# Patient Record
Sex: Male | Born: 1945 | Race: White | Hispanic: No | Marital: Married | Smoking: Former smoker
Health system: Southern US, Community
[De-identification: ages and names within clinical notes are randomized; demographics above are authoritative.]

## PROBLEM LIST (undated history)

## (undated) DIAGNOSIS — K219 Gastro-esophageal reflux disease without esophagitis: Secondary | ICD-10-CM

## (undated) DIAGNOSIS — G8929 Other chronic pain: Secondary | ICD-10-CM

## (undated) DIAGNOSIS — I10 Essential (primary) hypertension: Secondary | ICD-10-CM

## (undated) DIAGNOSIS — M545 Low back pain, unspecified: Secondary | ICD-10-CM

## (undated) DIAGNOSIS — M5430 Sciatica, unspecified side: Secondary | ICD-10-CM

## (undated) DIAGNOSIS — E785 Hyperlipidemia, unspecified: Secondary | ICD-10-CM

## (undated) HISTORY — DX: Sciatica, unspecified side: M54.30

## (undated) HISTORY — PX: MASTECTOMY: SHX3

## (undated) HISTORY — PX: LAPAROSCOPIC CHOLECYSTECTOMY: SUR755

## (undated) HISTORY — DX: Hyperlipidemia, unspecified: E78.5

## (undated) HISTORY — PX: SHOULDER SURGERY: SHX246

## (undated) HISTORY — DX: Low back pain, unspecified: M54.50

## (undated) HISTORY — DX: Low back pain: M54.5

## (undated) HISTORY — PX: WRIST ARTHRODESIS: SUR65

## (undated) HISTORY — DX: Other chronic pain: G89.29

## (undated) HISTORY — DX: Essential (primary) hypertension: I10

---

## 2000-08-06 ENCOUNTER — Ambulatory Visit (HOSPITAL_COMMUNITY): Admission: RE | Admit: 2000-08-06 | Discharge: 2000-08-06 | Payer: Self-pay | Admitting: Dermatology

## 2000-08-06 ENCOUNTER — Encounter: Payer: Self-pay | Admitting: Dermatology

## 2001-06-06 ENCOUNTER — Encounter: Payer: Self-pay | Admitting: Preventative Medicine

## 2001-06-06 ENCOUNTER — Ambulatory Visit (HOSPITAL_COMMUNITY): Admission: RE | Admit: 2001-06-06 | Discharge: 2001-06-06 | Payer: Self-pay | Admitting: Preventative Medicine

## 2001-06-10 ENCOUNTER — Encounter: Payer: Self-pay | Admitting: Preventative Medicine

## 2001-06-10 ENCOUNTER — Ambulatory Visit (HOSPITAL_COMMUNITY): Admission: RE | Admit: 2001-06-10 | Discharge: 2001-06-10 | Payer: Self-pay | Admitting: Preventative Medicine

## 2001-09-14 ENCOUNTER — Encounter: Payer: Self-pay | Admitting: Dermatology

## 2001-09-14 ENCOUNTER — Ambulatory Visit (HOSPITAL_COMMUNITY): Admission: RE | Admit: 2001-09-14 | Discharge: 2001-09-14 | Payer: Self-pay | Admitting: Dermatology

## 2002-02-17 ENCOUNTER — Emergency Department (HOSPITAL_COMMUNITY): Admission: EM | Admit: 2002-02-17 | Discharge: 2002-02-17 | Payer: Self-pay

## 2002-04-06 ENCOUNTER — Emergency Department (HOSPITAL_COMMUNITY): Admission: EM | Admit: 2002-04-06 | Discharge: 2002-04-06 | Payer: Self-pay | Admitting: *Deleted

## 2002-04-06 ENCOUNTER — Encounter: Payer: Self-pay | Admitting: *Deleted

## 2002-06-06 ENCOUNTER — Encounter: Admission: RE | Admit: 2002-06-06 | Discharge: 2002-07-26 | Payer: Self-pay | Admitting: Family Medicine

## 2002-06-13 ENCOUNTER — Encounter: Admission: RE | Admit: 2002-06-13 | Discharge: 2002-06-13 | Payer: Self-pay | Admitting: Gastroenterology

## 2002-06-13 ENCOUNTER — Encounter: Payer: Self-pay | Admitting: Gastroenterology

## 2002-07-11 ENCOUNTER — Encounter: Payer: Self-pay | Admitting: Family Medicine

## 2002-07-11 ENCOUNTER — Ambulatory Visit (HOSPITAL_COMMUNITY): Admission: RE | Admit: 2002-07-11 | Discharge: 2002-07-11 | Payer: Self-pay | Admitting: Family Medicine

## 2002-08-30 ENCOUNTER — Encounter (INDEPENDENT_AMBULATORY_CARE_PROVIDER_SITE_OTHER): Payer: Self-pay

## 2002-08-30 ENCOUNTER — Ambulatory Visit (HOSPITAL_COMMUNITY): Admission: RE | Admit: 2002-08-30 | Discharge: 2002-08-30 | Payer: Self-pay | Admitting: Gastroenterology

## 2002-09-22 ENCOUNTER — Encounter: Payer: Self-pay | Admitting: Orthopedic Surgery

## 2002-09-22 ENCOUNTER — Ambulatory Visit (HOSPITAL_COMMUNITY): Admission: RE | Admit: 2002-09-22 | Discharge: 2002-09-22 | Payer: Self-pay | Admitting: Orthopedic Surgery

## 2002-11-13 ENCOUNTER — Encounter (HOSPITAL_COMMUNITY): Admission: RE | Admit: 2002-11-13 | Discharge: 2002-12-13 | Payer: Self-pay | Admitting: Orthopedic Surgery

## 2002-12-14 ENCOUNTER — Encounter (HOSPITAL_COMMUNITY): Admission: RE | Admit: 2002-12-14 | Discharge: 2003-01-13 | Payer: Self-pay | Admitting: Orthopedic Surgery

## 2004-01-15 ENCOUNTER — Ambulatory Visit: Payer: Self-pay | Admitting: Cardiology

## 2004-10-14 ENCOUNTER — Ambulatory Visit (HOSPITAL_COMMUNITY): Admission: RE | Admit: 2004-10-14 | Discharge: 2004-10-14 | Payer: Self-pay | Admitting: Dermatology

## 2005-07-20 ENCOUNTER — Ambulatory Visit (HOSPITAL_COMMUNITY): Admission: RE | Admit: 2005-07-20 | Discharge: 2005-07-20 | Payer: Self-pay | Admitting: Dermatology

## 2006-10-23 ENCOUNTER — Encounter: Admission: RE | Admit: 2006-10-23 | Discharge: 2006-10-23 | Payer: Self-pay | Admitting: Orthopedic Surgery

## 2007-02-24 ENCOUNTER — Ambulatory Visit (HOSPITAL_BASED_OUTPATIENT_CLINIC_OR_DEPARTMENT_OTHER): Admission: RE | Admit: 2007-02-24 | Discharge: 2007-02-24 | Payer: Self-pay | Admitting: Orthopedic Surgery

## 2007-03-09 ENCOUNTER — Encounter (HOSPITAL_COMMUNITY): Admission: RE | Admit: 2007-03-09 | Discharge: 2007-04-08 | Payer: Self-pay | Admitting: Orthopedic Surgery

## 2008-03-21 ENCOUNTER — Encounter: Admission: RE | Admit: 2008-03-21 | Discharge: 2008-03-21 | Payer: Self-pay | Admitting: Orthopedic Surgery

## 2008-07-11 ENCOUNTER — Emergency Department (HOSPITAL_COMMUNITY): Admission: EM | Admit: 2008-07-11 | Discharge: 2008-07-11 | Payer: Self-pay | Admitting: Emergency Medicine

## 2009-04-26 ENCOUNTER — Encounter: Admission: RE | Admit: 2009-04-26 | Discharge: 2009-04-26 | Payer: Self-pay | Admitting: Family Medicine

## 2009-05-08 ENCOUNTER — Encounter (HOSPITAL_COMMUNITY): Admission: RE | Admit: 2009-05-08 | Discharge: 2009-06-07 | Payer: Self-pay | Admitting: Family Medicine

## 2009-12-24 ENCOUNTER — Ambulatory Visit: Payer: Self-pay | Admitting: Cardiology

## 2009-12-24 DIAGNOSIS — R002 Palpitations: Secondary | ICD-10-CM

## 2009-12-24 DIAGNOSIS — E785 Hyperlipidemia, unspecified: Secondary | ICD-10-CM | POA: Insufficient documentation

## 2009-12-24 DIAGNOSIS — I10 Essential (primary) hypertension: Secondary | ICD-10-CM | POA: Insufficient documentation

## 2009-12-25 ENCOUNTER — Encounter: Payer: Self-pay | Admitting: Cardiology

## 2009-12-30 ENCOUNTER — Ambulatory Visit (HOSPITAL_COMMUNITY)
Admission: RE | Admit: 2009-12-30 | Discharge: 2009-12-31 | Payer: Self-pay | Source: Home / Self Care | Admitting: Cardiology

## 2010-01-07 ENCOUNTER — Ambulatory Visit: Payer: Self-pay | Admitting: Cardiovascular Disease

## 2010-01-09 ENCOUNTER — Encounter: Payer: Self-pay | Admitting: Adult Health

## 2010-02-25 NOTE — Assessment & Plan Note (Signed)
Summary: ***per Dr.Fred Andrey Campanile for palpitaitons/tg   Visit Type:  Initial Consult Primary Provider:  Dr. Benedetto Goad   History of Present Illness: 65 year old male referred for cardiology consultation. He reports a long-standing, at least 2 year, history of intermittent palpitations. Records indicate that he was seen by Dr. Elease Hashimoto within the year, although details are unavailable. The patient states he did not undergo any further cardiac testing other than ECG.  He describes feeling occasionally "racing" heart beats, sometimes "skips" usually when he is quiet and still. This does not bother him much at work. He does physical labor in the HVAC, contracting business. No clearly reproducible exertional chest pain compatible with angina. No unusual shortness of breath. He has not had any frank dizziness or syncope associated with palpitations. He reports that the symptoms seem to be increasing in frequency, essentially daily at this time. This is despite cutting back caffeine.  Faxed copy of ECG from May of this year shows sinus bradycardia at 58 beats per minute, nonspecific T-wave changes.  He has never worn a cardiac monitor to objectively document what he is feeling.  Preventive Screening-Counseling & Management  Alcohol-Tobacco     Smoking Status: never  Current Medications (verified): 1)  Clonazepam 1 Mg Tabs (Clonazepam) .... Take 1/2 Tab As Needed 2)  Lipitor 20 Mg Tabs (Atorvastatin Calcium) .... Take 1 Tab Daily 3)  Amlodipine Besylate 5 Mg Tabs (Amlodipine Besylate) .... Take 1 Tab Daily 4)  Benazepril Hcl 10 Mg Tabs (Benazepril Hcl) .... Take 1 Tab Daily 5)  Omeprazole 20 Mg Cpdr (Omeprazole) .... Take 1 Tab Daily 6)  Aspir-Low 81 Mg Tbec (Aspirin) .... Take 1 Tab Daily 7)  Flax Seed Oil 1000 Mg Caps (Flaxseed (Linseed)) .... Take 1 Tab Daily 8)  Folic Acid 1 Mg Tabs (Folic Acid) .... Take 1 Tab Daily 9)  Metamucil 48.57 % Powd (Psyllium) .... Use Daily 10)  Vitamin D3 1000  Unit Caps (Cholecalciferol) .... Take 1 Tab Daily  Allergies (verified): 1)  ! Codeine  Past History:  Family History: Last updated: 12/24/2009 Father: CABG age 39, also history of stroke and pacemaker Mother: died age 23 with lung cancer Siblings: brother without known cardiac disease  Social History: Last updated: 12/24/2009 Full Time - HVAC/contractor Tobacco Use - No.  Alcohol Use - yes, socially  Past Medical History: Hyperlipidemia Hypertension Sciatica Herpes simplex Chronic lower back pain  Past Surgical History: Right shoulder surgery Laparoscopic cholecystectomy Mastectomy  Family History: Father: CABG age 56, also history of stroke and pacemaker Mother: died age 5 with lung cancer Siblings: brother without known cardiac disease  Social History: Full Time - HVAC/contractor Tobacco Use - No.  Alcohol Use - yes, socially Smoking Status:  never  Review of Systems  The patient denies anorexia, fever, weight loss, syncope, dyspnea on exertion, peripheral edema, prolonged cough, headaches, abdominal pain, melena, and hematochezia.         Otherwise reviewed and negative.  Vital Signs:  Patient profile:   65 year old male Height:      68 inches Weight:      164 pounds BMI:     25.03 Pulse rate:   58 / minute BP sitting:   133 / 86  (right arm)  Vitals Entered By: Dreama Saa, CNA (December 24, 2009 9:14 AM)  Physical Exam  Additional Exam:  Normally nourished appearing male in no acute distress. HEENT: Conjunctiva and lids normal, oropharynx with moist mucosa. Neck: Supple, no elevated JVP or  bruits, no thyromegaly Lungs: Clear to auscultation, nonlabored. Cardiac: Regular rate and rhythm, no S3 or pericardial rub. No significant systolic murmur. Abdomen: Soft, nontender, bowel sounds present. Skin: Warm and dry. Musculoskeletal: No gross deformity. Extremities: No pitting edema, distal pulses full. Neuropsychiatric: Alert and oriented x3,  affect appropriate.   EKG  Procedure date:  12/24/2009  Findings:      Sinus bradycardia at 55 beats per minute.  Impression & Recommendations:  Problem # 1:  PALPITATIONS (ICD-785.1)  Long-standing, intermittent over time, however progressive in nature despite cutting back caffeine. No clear precipitant with exertion, no dizziness or syncope. Resting ECG is essentially normal. Plan will be a 48-hour Holter monitor given the fact that he is having daily symptoms, to better document objectively what he is feeling. Mainly need to exclude any significant paroxysmal arrhythmias that might warrant further investigation. He could simply be feeling ectopy. I will bring him back to the office over the next few weeks to discuss the results. No medication changes made.  His updated medication list for this problem includes:    Amlodipine Besylate 5 Mg Tabs (Amlodipine besylate) .Marland Kitchen... Take 1 tab daily    Benazepril Hcl 10 Mg Tabs (Benazepril hcl) .Marland Kitchen... Take 1 tab daily    Aspir-low 81 Mg Tbec (Aspirin) .Marland Kitchen... Take 1 tab daily  Orders: Holter Monitor (Holter Monitor)  Problem # 2:  HYPERTENSION (ICD-401.9)  Blood pressure followed by Dr. Andrey Campanile. Medications reviewed.  His updated medication list for this problem includes:    Amlodipine Besylate 5 Mg Tabs (Amlodipine besylate) .Marland Kitchen... Take 1 tab daily    Benazepril Hcl 10 Mg Tabs (Benazepril hcl) .Marland Kitchen... Take 1 tab daily    Aspir-low 81 Mg Tbec (Aspirin) .Marland Kitchen... Take 1 tab daily  Problem # 3:  HYPERLIPIDEMIA (ICD-272.4)  On statin therapy, followed by Dr. Andrey Campanile.  His updated medication list for this problem includes:    Lipitor 20 Mg Tabs (Atorvastatin calcium) .Marland Kitchen... Take 1 tab daily  Patient Instructions: 1)  Your physician recommends that you schedule a follow-up appointment in: 2 to 3 weeks 2)  Your physician recommends that you continue on your current medications as directed. Please refer to the Current Medication list given to you  today. 3)  Your physician has recommended that you wear a holter monitor.  Holter monitors are medical devices that record the heart's electrical activity. Doctors most often use these monitors to diagnose arrhythmias. Arrhythmias are problems with the speed or rhythm of the heartbeat. The monitor is a small, portable device. You can wear one while you do your normal daily activities. This is usually used to diagnose what is causing palpitations/syncope (passing out).

## 2010-02-25 NOTE — Letter (Signed)
Summary: [employer]  [employer] [facility]   Imported By: Faythe Ghee 12/25/2009 10:57:12  _____________________________________________________________________  External Attachment:    Type:   Image     Comment:   External Document

## 2010-02-27 NOTE — Procedures (Signed)
Summary: Holter and Event  Holter and Event   Imported By: Faythe Ghee 01/09/2010 09:35:53  _____________________________________________________________________  External Attachment:    Type:   Image     Comment:   External Document

## 2010-02-27 NOTE — Assessment & Plan Note (Signed)
Summary: 2-3 wk f/u per checkout on 12/24/09/tg   Visit Type:  Follow-up Primary Provider:  Dr. Benedetto Goad  CC:  no cardiology complaints.  History of Present Illness: [name] is a  65 y/o CM we are following after initial consultation for palpatations and was seen by Dr. Diona Browner on 12/24/2009.  Holter monitor was placed to assess frequency and duration.  He has a history of hypercholesterolemia and hypertension which is controlled and followed by his PMD, Dr. Andrey Campanile.  He continues to have some complaints of palpatations, mostly at night when he goes to bed.  He does not feel them during the day.  Current Medications (verified): 1)  Clonazepam 1 Mg Tabs (Clonazepam) .... Take 1/2 Tab As Needed 2)  Lipitor 20 Mg Tabs (Atorvastatin Calcium) .... Take 1 Tab Daily 3)  Amlodipine Besylate 5 Mg Tabs (Amlodipine Besylate) .... Take 1 Tab Daily 4)  Benazepril Hcl 10 Mg Tabs (Benazepril Hcl) .... Take 1 Tab Daily 5)  Omeprazole 20 Mg Cpdr (Omeprazole) .... Take 1 Tab Daily 6)  Aspir-Low 81 Mg Tbec (Aspirin) .... Take 1 Tab Daily 7)  Flax Seed Oil 1000 Mg Caps (Flaxseed (Linseed)) .... Take 1 Tab Daily 8)  Folic Acid 1 Mg Tabs (Folic Acid) .... Take 1 Tab Daily 9)  Metamucil 48.57 % Powd (Psyllium) .... Use Daily 10)  Vitamin D3 1000 Unit Caps (Cholecalciferol) .... Take 1 Tab Daily  Allergies (verified): 1)  ! Codeine  Comments:  Nurse/Medical Assistant: patient states all meds are the same from last ov  Review of Systems       Nocturanl palpatations.  All other systems have been reviewed and are negative unless stated above.   Vital Signs:  Patient profile:   65 year old male Weight:      164 pounds O2 Sat:      92 % on Room air Pulse rate:   60 / minute BP sitting:   116 / 70  (right arm)  Vitals Entered By: Dreama Saa, CNA (January 08, 2010 11:09 AM)  O2 Flow:  Room air  Physical Exam  General:  Well developed, well nourished, in no acute distress. Lungs:  Clear  bilaterally to auscultation and percussion. Heart:  Non-displaced PMI, chest non-tender; regular rate and rhythm, S1, S2 without murmurs, rubs or gallops. Carotid upstroke normal, no bruit. Normal abdominal aortic size, no bruits. Femorals normal pulses, no bruits. Pedals normal pulses. No edema, no varicosities. Msk:  Back normal, normal gait. Muscle strength and tone normal. Pulses:  pulses normal in all 4 extremities Psych:  Normal affect.   Impression & Recommendations:  Problem # 1:  PALPITATIONS (ICD-785.1) Review of holter monitor read by Dr. Diona Browner demonstrates occaisonal PVC;s sometimes frequent on occasion but < 3.5% of total beats.  No afib, VT or pauses.  We will continue him on current medications. Reassurence is given and he is advised to avoid caffine in the evenings.  He states that he takes a clonazempam for RLS and when he takes it is also helps with palpatations.  I have advised him to continue this as needed.  If he has more freqent palpatations or those that are sustained he is to call us for follow up evaluation.  Otherwise he will be seen on a as needed basis. His updated medication list for this problem includes:    Amlodipine Besylate 5 Mg Tabs (Amlodipine besylate) .Marland Kitchen... Take 1 tab daily    Benazepril Hcl 10 Mg Tabs (Benazepril hcl) .Marland KitchenMarland KitchenMarland KitchenMarland Kitchen  Take 1 tab daily    Aspir-low 81 Mg Tbec (Aspirin) .Marland Kitchen... Take 1 tab daily  Problem # 2:  HYPERTENSION (ICD-401.9) Well controlled, no medication changes. His updated medication list for this problem includes:    Amlodipine Besylate 5 Mg Tabs (Amlodipine besylate) .Marland Kitchen... Take 1 tab daily    Benazepril Hcl 10 Mg Tabs (Benazepril hcl) .Marland Kitchen... Take 1 tab daily    Aspir-low 81 Mg Tbec (Aspirin) .Marland Kitchen... Take 1 tab daily  Patient Instructions: 1)  Your physician recommends that you schedule a follow-up appointment in: as needed 2)  Your physician recommends that you continue on your current medications as directed. Please refer to the  Current Medication list given to you today.

## 2010-06-10 NOTE — Op Note (Signed)
NAMEDAEMON, DOWTY              ACCOUNT NO.:  [id number]   MEDICAL RECORD NO.:  [id number]          PATIENT TYPE:  AMB   LOCATION:  DSC                          FACILITY:  [facility]   PHYSICIAN:  Rodney A. Mortenson, M.D.DATE OF BIRTH:  Dec 28, 1945   DATE OF PROCEDURE:  02/24/2007  DATE OF DISCHARGE:                               OPERATIVE REPORT   JUSTIFICATION:  A 65 year old male with a long history of right shoulder  pain.  He has good strength.  No tenderness over the [facility] joint.  Initial  testing extremely painful.  He obtained temporary relief from  subacromial injections but not longstanding relief.  An MRI was done  that shows degenerative changes of the [facility] joint and lateral downsloping  of the acromion.  There is also had a type 2 SLAP lesion seen on the MRI  with his shoulder.  Treatment was discussed with the patient  extensively.  Questions were answered and encouraged.  Complications  discussed preoperatively.   PREOPERATIVE DIAGNOSES:  1. Superior labrum anterior and posterior II, lesion right shoulder.  2. Impingement, rotator cuff, right shoulder, secondary to acromial      and inferior clavicular bone spurs.   POSTOPERATIVE DIAGNOSES:  1. Fraying and tearing, superior labrum, right shoulder.  2. Impingement, right shoulder, secondary to the inferior bone spurs,      distal clavicle, and downsloping of the acromion.   OPERATION:  1. Arthroscopic evaluation of the shoulder.  2. Debridement of superior labrum.  3. Subacromial decompression and distal clavicle resection on the      inferior surface and removal inferior impinging bone spurs, distal      clavicle, right shoulder.   SURGEON:  Lenard Galloway. Chaney Malling, M.D.   ASSISTANT:  Arlys John D. Petrarca, P.A.-C.   ANESTHESIA:  General.   PROCEDURE:  The patient placed on the operating table in supine  position.  After satisfactory general anesthesia, the patient was placed  in a semi-sitting position.  The right  shoulder was prepped with  DuraPrep and draped out in the usual manner.  The arthroscope was placed  in the standard posterior portal and an anterior operative portal was  inserted into the glenohumeral joint.  The articular cartilage over the  humeral head and the glenoid appeared absolutely normal.  There was  fraying of the labrum anteriorly, but this was stable.  Superiorly,  however, the labrum was extensively torn but the anchor of the biceps  about the superior portion of the glenoid was normal.  The biceps itself  exited out of the rotator cuff very nicely and no abnormality was seen.  Anterior to the biceps there was fraying of the articular surface of the  supraspinatus.  No through-and-through rotator cuff tear was seen.  Through the anterior portal, a full-radius resector was inserted and the  frayed portion of the labrum was debrided and the superior portion  labrum where it was frayed and torn was also debrided.  Great care was  taken to test the stability of the biceps anchor and labrum.  This was  intact as noted above.  The  arthroscope was then removed and placed in  the subacromial space.  There was marked fraying of the anterior  supraspinatus but no through-and-through tear could be seen.  There were  inferior bone spurs on the distal acromion and the undersurface of the  clavicle.  Using the ablation tool, the soft tissue was stripped off the  undersurface of the acromion and undersurface of the clavicle.  Excellent visualization was achieved.  Through a lateral port a 6-mm bur  was inserted and an acromioplasty was done.  Excellent decompression of  bone spurs was achieved very nicely.  The undersurface of the distal  clavicle was also debrided back and all spurs in this area were trimmed.  The shoulder was then put through a full range of motion and there was  no significant impingement of the cuff at this point.  I was very  pleased with the surgical decompression  that was achieved.  Nylon 4-0  was used to close skin, a sterile dressing was applied and the patient  returned to the recovery room in excellent condition.  Technically this  procedure went extremely well.   DRAINS:  None.   COMPLICATIONS:  None.   DISPOSITION:  1. Mepergan Fortis for pain.  2. Usual postoperative instructions.  3. To my office next week.      Rodney A. Chaney Malling, M.D.  Electronically Signed     RAM/MEDQ  D:  02/24/2007  T:  02/24/2007  Job:  [id number]

## 2010-06-13 NOTE — Op Note (Signed)
   NAME:  Douglas Lindsey, Douglas Lindsey                        ACCOUNT NO.:  [id number]   MEDICAL RECORD NO.:  [id number]                   PATIENT TYPE:  AMB   LOCATION:  ENDO                                 FACILITY:  [facility]   PHYSICIAN:  Anselmo Rod, M.D.               DATE OF BIRTH:  November 05, 1945   DATE OF PROCEDURE:  08/30/2002  DATE OF DISCHARGE:                                 OPERATIVE REPORT   PROCEDURE:  Colonoscopy with snare polypectomy x 2.   ENDOSCOPIST:  Charna Elizabeth, M.D.   INSTRUMENT USED:  Olympus video colonoscope.   INDICATIONS FOR PROCEDURE:  Trace guaiac positive stools in a 65 year old  white male undergoing screening colonoscopy.  The patient has a family  history of colonic polyps in his father and has had a melanoma on his right  arm.   PREPROCEDURE PREPARATION:  Informed consent was obtained from the patient.  The patient was fasted for eight hours prior to the procedure and prepped  with a bottle of magnesium citrate and a gallon of GoLYTELY the night prior  to the procedure.   PREPROCEDURE PHYSICAL:  Patient with stable vital signs.  Neck supple.  Chest clear to auscultation.  S1 and S2 regular.  Abdomen soft with normal  bowel sounds.   DESCRIPTION OF PROCEDURE:  The patient was placed in the left lateral  decubitus position, sedated with 70 mg of Demerol and 7 mg Versed  intravenously.  Once the patient was adequately sedated, maintained on low  flow oxygen, continuous cardiac monitoring, the Olympus video colonoscope  was advanced into the rectum to the cecum.  There was some residual stool in  the colon, multiple washes were done.  A small polyp was snared from 20 cm.  The rest of the colonic mucosa up to the terminal ileum appeared normal.  Multiple washes were done.  The appendiceal orifice and ileocecal valve were  clearly visualized and photographed.   IMPRESSION:  Normal colonoscopy to the terminal ileum except for two small  polyps snared from 20  cm.                    RECOMMENDATIONS:  1. Await pathology results.  2. Repeat guaiacs on an outpatient basis.  3. No nonsteroidals including aspirin for the next two weeks.  4. Repeat colonoscopy depending on the pathology results.                                               Anselmo Rod, M.D.    JNM/MEDQ  D:  08/31/2002  T:  08/31/2002  Job:  [id number]   cc:   Tammy R. Collins [place], M.D.  [address]  [place]  [place] [address]  Fax: [phone]

## 2010-10-03 ENCOUNTER — Ambulatory Visit
Admission: RE | Admit: 2010-10-03 | Discharge: 2010-10-03 | Disposition: A | Discharge status: Home / Self Care | Payer: Medicare Other | Source: Ambulatory Visit | Attending: Family Medicine | Admitting: Family Medicine

## 2010-10-03 ENCOUNTER — Other Ambulatory Visit: Payer: Self-pay | Admitting: Family Medicine

## 2010-10-03 DIAGNOSIS — M549 Dorsalgia, unspecified: Secondary | ICD-10-CM

## 2010-10-07 ENCOUNTER — Other Ambulatory Visit: Payer: Self-pay

## 2010-10-17 LAB — BASIC METABOLIC PANEL
BUN: 10
CO2: 27
Creatinine, Ser: 0.97
Glucose, Bld: 109 — ABNORMAL HIGH
Potassium: 3.9
Sodium: 141

## 2010-10-17 LAB — POCT HEMOGLOBIN-HEMACUE: Hemoglobin: 15.8

## 2010-11-17 ENCOUNTER — Encounter: Payer: Self-pay | Admitting: Cardiovascular Disease

## 2011-04-08 ENCOUNTER — Encounter: Payer: Self-pay | Admitting: Cardiovascular Disease

## 2012-08-31 ENCOUNTER — Other Ambulatory Visit: Payer: Self-pay | Admitting: Family Medicine

## 2012-08-31 DIAGNOSIS — N50819 Testicular pain, unspecified: Secondary | ICD-10-CM

## 2012-08-31 DIAGNOSIS — R609 Edema, unspecified: Secondary | ICD-10-CM

## 2012-09-01 ENCOUNTER — Ambulatory Visit
Admission: RE | Admit: 2012-09-01 | Discharge: 2012-09-01 | Disposition: A | Discharge status: Home / Self Care | Payer: Medicare Other | Source: Ambulatory Visit | Attending: Family Medicine | Admitting: Family Medicine

## 2012-09-01 ENCOUNTER — Other Ambulatory Visit: Payer: Self-pay | Admitting: Family Medicine

## 2012-09-01 DIAGNOSIS — R609 Edema, unspecified: Secondary | ICD-10-CM

## 2012-09-01 DIAGNOSIS — N50819 Testicular pain, unspecified: Secondary | ICD-10-CM

## 2012-09-02 ENCOUNTER — Other Ambulatory Visit: Payer: Medicare Other

## 2013-06-29 ENCOUNTER — Other Ambulatory Visit: Payer: Self-pay | Admitting: Family Medicine

## 2013-06-29 DIAGNOSIS — Z87891 Personal history of nicotine dependence: Secondary | ICD-10-CM

## 2013-07-06 ENCOUNTER — Ambulatory Visit
Admission: RE | Admit: 2013-07-06 | Discharge: 2013-07-06 | Disposition: A | Discharge status: Home / Self Care | Payer: Medicare Other | Source: Ambulatory Visit | Attending: Family Medicine | Admitting: Family Medicine

## 2013-07-06 DIAGNOSIS — Z87891 Personal history of nicotine dependence: Secondary | ICD-10-CM

## 2014-01-15 ENCOUNTER — Other Ambulatory Visit: Payer: Self-pay | Admitting: Dermatology

## 2014-03-16 ENCOUNTER — Telehealth: Payer: Self-pay | Admitting: Hematology & Oncology

## 2014-03-16 NOTE — Telephone Encounter (Signed)
Pt called wanting to make appointment, I transferred to RN. Per RN pt will have referring fax chart and then we will schedule appointment

## 2014-04-04 ENCOUNTER — Telehealth: Payer: Self-pay | Admitting: Hematology & Oncology

## 2014-04-04 NOTE — Telephone Encounter (Signed)
Left vm w NEW PATIENT today to remind them of their appointment with Dr. Ennever. Also, advised them to bring all medication bottles and insurance card information. ° °

## 2014-04-05 ENCOUNTER — Encounter: Payer: Self-pay | Admitting: Hematology & Oncology

## 2014-04-05 ENCOUNTER — Ambulatory Visit (HOSPITAL_BASED_OUTPATIENT_CLINIC_OR_DEPARTMENT_OTHER): Payer: Medicare Other | Admitting: Hematology & Oncology

## 2014-04-05 ENCOUNTER — Other Ambulatory Visit: Payer: Medicare Other | Admitting: Lab

## 2014-04-05 ENCOUNTER — Ambulatory Visit (HOSPITAL_BASED_OUTPATIENT_CLINIC_OR_DEPARTMENT_OTHER): Payer: Medicare Other

## 2014-04-05 VITALS — BP 151/77 | HR 66 | Temp 98.4°F | Resp 18 | Ht 68.0 in | Wt 171.0 lb

## 2014-04-05 DIAGNOSIS — Z87891 Personal history of nicotine dependence: Secondary | ICD-10-CM

## 2014-04-05 DIAGNOSIS — Z85820 Personal history of malignant melanoma of skin: Secondary | ICD-10-CM

## 2014-04-05 DIAGNOSIS — Z801 Family history of malignant neoplasm of trachea, bronchus and lung: Secondary | ICD-10-CM

## 2014-04-05 DIAGNOSIS — C61 Malignant neoplasm of prostate: Secondary | ICD-10-CM

## 2014-04-05 NOTE — Progress Notes (Signed)
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 NEW PATIENT  : I'm here to make sure that everything is being looked into for my health  HPI: Douglas Lindsey is a very nice 69 year old Caucasian gentleman. He was a former Engineer, structural in the [employer] He also had been in the [employer].  He recently was found to have prostate cancer. His PSA had been a little above 5. He has had this before. He has been on antibiotics and it goes down.  He sees Dr. Karsten Ro. Dr.Ottelin got a PSA on him back in October 2015. This was at 5.86.  It was 5.24 in January of this year area did  He then underwent a prostate biopsy. This was done on February 11. He had 3 biopsies that contained prostate cancer. 2 of the 3 biopsies had 10% or less. One biopsy had 60%. All 3 biopsies had a Gleason score of 6.  Dr. Karsten Ro felt that he dyspnea follow-up in 6 months. He felt that active surveillance would be appropriate.  His daughter works at [employer]. She felt that he needed to be seen by oncology. As such, he was calmly referred to the [facility].  Douglas Lindsey also has a history of melanoma. 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 He does get skin exams twice a year. 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 He's had basal cell carcinoma.  He had a colonoscopy just 2 years ago. He had this at the [facility] in term. Is actually was part of a study.  He still works. He does houses and infections. There may be some stresses exposure but not that much.  He used to smoke. He stopped about 40 years ago. He was smoked maybe 8-10 years. He smoked maybe a pack a day.  He is had no problems with cough.  He had a chest x-ray about a month ago because of "pneumonia".  He's had no weight loss or weight gain.  He does enjoy some wine and alcohol in the evening. This helps him to relax a little bit.  He's had no visual issues. He's had no swallowing problems.  He's had no leg swelling. He's had no rashes.    Past Medical History  Diagnosis Date  . Hyperlipidemia   . Hypertension   . Sciatica   . Herpes simplex   . Chronic lower back pain   :  Past Surgical History  Procedure Laterality Date  . Shoulder surgery      Right  . Laparoscopic cholecystectomy    . Mastectomy    :   Current outpatient prescriptions:  .  amLODipine (NORVASC) 5 MG tablet, Take 5 mg by mouth daily.  , Disp: , Rfl:  .  aspirin 81 MG tablet, Take 81 mg by mouth daily.  ,  Disp: , Rfl:  .  atorvastatin (LIPITOR) 20 MG tablet, Take 20 mg by mouth daily.  , Disp: , Rfl:  .  benazepril (LOTENSIN) 10 MG tablet, Take 10 mg by mouth daily.  , Disp: , Rfl:  .  Cholecalciferol (VITAMIN D3) 1000 UNITS CAPS, Take 1 capsule by mouth daily.  , Disp: , Rfl:  .  clonazePAM (KLONOPIN) 1 MG tablet, Take 0.5 mg by mouth as needed.  , Disp: , Rfl:  .  Cranberry 125 MG TABS, Take by mouth every morning., Disp: , Rfl:  .  Cyanocobalamin (VITAMIN B 12) 250 MCG LOZG, Take by mouth., Disp: , Rfl:  .  Flaxseed, Linseed, (FLAX SEED OIL) 1000 MG CAPS, Take 1 capsule by mouth daily.  , Disp: , Rfl:  .  folic acid (FOLVITE) 1 MG tablet, Take 1 mg by mouth daily.  , Disp: , Rfl:  .  omeprazole (PRILOSEC) 20 MG capsule, Take 20 mg by mouth daily.  , Disp: , Rfl:  .  vitamin A 8000 UNIT capsule, Take 8,000 Units by mouth daily., Disp: , Rfl:  .  Psyllium (METAMUCIL) 48.57 % POWD, Take by mouth daily.  , Disp: , Rfl: :  :  Allergies  Allergen Reactions  . Codeine   :  Family History  Problem Relation Age of Onset  . Lung cancer Mother   . Stroke Father   :  History   Social History  . Marital Status: Married    Spouse  Name: N/A  . Number of Children: N/A  . Years of Education: N/A   Occupational History  . Full time - HVAC/ contractor    Social History Main Topics  . Smoking status: Former Smoker -- 1.00 packs/day for 10 years    Types: Cigarettes    Quit date: 04/05/1974  . Smokeless tobacco: Never Used     Comment: quit 40 years ago  . Alcohol Use: 0.0 oz/week    0 Standard drinks or equivalent per week     Comment: Socially  . Drug Use: Not on file  . Sexual Activity: Not on file   Other Topics Concern  . Not on file   Social History Narrative  :  Pertinent items are noted in HPI.  Exam: @IPVITALS @  well developed and well-nourished white gentleman. Vital signs show temperature of 98.4. Pulse 66. Blood pressure 151/77. Weight is 171 pounds Head and neck exam shows  shows no ocular or oral lesions. He has no adenopathy in the neck. Thyroid is nonpalpable. Lungs are clear. Cardiac exam regular rate and rhythm with no murmurs, rubs or bruits. Abdomen is soft. Has good bowel sounds. There is no fluid wave. There is no palpable liver or spleen tip. Back exam shows no tenderness over the spine, ribs or hips. Extremities shows the wide local excision on the right upper arm. This is well-healed. He has good range of motion of his joints. He has good strength. Skin exam shows some actinic keratoses. He may have a few basal cell carcinomas but these appear to be small. He has no suspicious hyperpigmented lesions. Neurological exam shows no focal neurological deficits.      No results for input(s): WBC, HGB, HCT, PLT in the last 72 hours. No results for input(s): NA, K, CL, CO2, GLUCOSE, BUN, CREATININE, CALCIUM in the last 72 hours.  Blood smear review: none   Pathology:none   Assessment and Plan: Douglas Lindsey is a 69 year old gentleman. He has a history  of early stage melanoma. It also looks like he has a history of early low risk stage prostate cancer.  I don't see anything that needs to be  done with his respect to the prostate cancer. Again this is low risk or very low risk by criteria. At his age, I think that active surveillance would be very appropriate for him. Dr. Karsten Ro is very diligent and will definitely be quite thorough in his surveillance protocol.  He sees dermatology twice a year for skin.  I don't see any specific blood tests that he to be done. I don't see any specific x-rays that need to be done.  I spent about 45 mins with him. I answered all his questions. I hopefully, reassured him that I thought he was going to do okay. I just don't think that prostate cancer is going to be a problem for him.

## 2014-09-03 ENCOUNTER — Encounter: Payer: Self-pay | Admitting: *Deleted

## 2014-09-04 ENCOUNTER — Encounter: Payer: Medicare Other | Admitting: Cardiology

## 2014-09-04 NOTE — Progress Notes (Signed)
Patient ID: Douglas Lindsey, male   DOB: 1945/02/08, 69 y.o.   MRN: [id number]     ERROR No Show

## 2015-04-10 ENCOUNTER — Other Ambulatory Visit: Payer: Self-pay | Admitting: Orthopaedic Surgery

## 2015-04-10 DIAGNOSIS — M25511 Pain in right shoulder: Secondary | ICD-10-CM

## 2015-04-10 DIAGNOSIS — M25562 Pain in left knee: Secondary | ICD-10-CM

## 2015-04-18 ENCOUNTER — Ambulatory Visit
Admission: RE | Admit: 2015-04-18 | Discharge: 2015-04-18 | Disposition: A | Discharge status: Home / Self Care | Payer: Medicare Other | Source: Ambulatory Visit | Attending: Orthopaedic Surgery | Admitting: Orthopaedic Surgery

## 2015-04-18 DIAGNOSIS — M25511 Pain in right shoulder: Secondary | ICD-10-CM

## 2015-04-18 DIAGNOSIS — M25562 Pain in left knee: Secondary | ICD-10-CM

## 2015-06-21 ENCOUNTER — Ambulatory Visit
Admission: RE | Admit: 2015-06-21 | Discharge: 2015-06-21 | Disposition: A | Discharge status: Home / Self Care | Payer: Medicare Other | Source: Ambulatory Visit | Attending: Physician Assistant | Admitting: Physician Assistant

## 2015-06-21 ENCOUNTER — Other Ambulatory Visit: Payer: Self-pay | Admitting: Physician Assistant

## 2015-06-21 DIAGNOSIS — R140 Abdominal distension (gaseous): Secondary | ICD-10-CM

## 2015-11-07 ENCOUNTER — Ambulatory Visit (INDEPENDENT_AMBULATORY_CARE_PROVIDER_SITE_OTHER): Payer: Medicare Other | Admitting: Orthopaedic Surgery

## 2015-11-07 DIAGNOSIS — M1711 Unilateral primary osteoarthritis, right knee: Secondary | ICD-10-CM | POA: Diagnosis not present

## 2015-12-17 ENCOUNTER — Ambulatory Visit (INDEPENDENT_AMBULATORY_CARE_PROVIDER_SITE_OTHER): Payer: Medicare Other | Admitting: Orthopaedic Surgery

## 2017-04-14 ENCOUNTER — Encounter (INDEPENDENT_AMBULATORY_CARE_PROVIDER_SITE_OTHER): Payer: Self-pay | Admitting: Orthopaedic Surgery

## 2017-04-14 ENCOUNTER — Ambulatory Visit (INDEPENDENT_AMBULATORY_CARE_PROVIDER_SITE_OTHER): Payer: Medicare Other | Admitting: Orthopaedic Surgery

## 2017-04-14 DIAGNOSIS — M79642 Pain in left hand: Secondary | ICD-10-CM

## 2017-04-14 DIAGNOSIS — M25562 Pain in left knee: Secondary | ICD-10-CM

## 2017-04-14 DIAGNOSIS — G8929 Other chronic pain: Secondary | ICD-10-CM | POA: Diagnosis not present

## 2017-04-14 DIAGNOSIS — M79641 Pain in right hand: Secondary | ICD-10-CM

## 2017-04-14 DIAGNOSIS — M25511 Pain in right shoulder: Secondary | ICD-10-CM

## 2017-04-14 NOTE — Progress Notes (Signed)
Office Visit Note   Patient: Douglas Lindsey           Date of Birth: 1945-05-04           MRN: [id number] Visit Date: 04/14/2017              Requested by: Christain Sacramento, MD [place], [place] [id number] PCP: Christain Sacramento, MD   Assessment & Plan: Visit Diagnoses:  1. Chronic right shoulder pain   2. Chronic pain of left knee   3. Bilateral hand pain     Plan: Impression is right shoulder rotator cuff syndrome, left knee medial meniscus tear, bilateral carpal tunnel syndrome.  Recommend MRI of the right shoulder and left knee to fully characterize suspected rotator cuff tear and a medial meniscal tear.  Nerve conduction studies to evaluate for bilateral carpal tunnel syndrome.  Follow-up after the studies.  Follow-Up Instructions: Return in about 2 weeks (around 04/28/2017).   Orders:  Orders Placed This Encounter  Procedures  . MR Knee Left w/o contrast  . MR SHOULDER RIGHT WO CONTRAST  . Ambulatory referral to Physical Medicine Rehab   No orders of the defined types were placed in this encounter.     Procedures: No procedures performed   Clinical Data: No additional findings.   Subjective: Chief Complaint  Patient presents with  . Left Wrist - Pain  . Right Shoulder - Pain    Patient is a very pleasant 72 year old gentleman who are known for the last couple years and comes in with continued right shoulder pain, left knee pain, bilateral hand pain and numbness.  In terms of his right shoulder he had an MRI back in 2017 which showed tendinosis of the rotator cuff with AC joint arthropathy and subacromial bursitis.  Left knee MRI showed a complex tear of the posterior horn the medial meniscus that did initially respond to the cortisone injection.  However he has had a recent worsening of pain of the medial joint line.  He also is complaining of right lateral hand numbness and pain is worse at night.    Review of Systems  Constitutional:  Negative.   All other systems reviewed and are negative.    Objective: Vital Signs: There were no vitals taken for this visit.  Physical Exam  Constitutional: He is oriented to person, place, and time. He appears well-developed and well-nourished.  Pulmonary/Chest: Effort normal.  Abdominal: Soft.  Neurological: He is alert and oriented to person, place, and time.  Skin: Skin is warm.  Psychiatric: He has a normal mood and affect. His behavior is normal. Judgment and thought content normal.  Nursing note and vitals reviewed.   Ortho Exam Right shoulder exam shows positive empty can and positive infraspinatus weakness with mild impingement.  Positive cross adduction. Left knee exam shows no joint effusion but he does have significant tenderness of the medial joint line.  Collaterals and cruciates are stable. Bilateral hand exam shows mildly positive carpal tunnel compressive signs. Specialty Comments:  No specialty comments available.  Imaging: No results found.   PMFS History: Patient Active Problem List   Diagnosis Date Noted  . HYPERLIPIDEMIA 12/24/2009  . HYPERTENSION 12/24/2009  . PALPITATIONS 12/24/2009   Past Medical History:  Diagnosis Date  . Chronic lower back pain   . Herpes simplex   . Hyperlipidemia   . Hypertension   . Sciatica     Family History  Problem Relation Age of Onset  .  Lung cancer Mother   . Stroke Father     Past Surgical History:  Procedure Laterality Date  . LAPAROSCOPIC CHOLECYSTECTOMY    . MASTECTOMY    . SHOULDER SURGERY     Right   Social History   Occupational History  . Occupation: Full time - Administrator, sports: RETIRED  Tobacco Use  . Smoking status: Former Smoker    Packs/day: 1.00    Years: 10.00    Pack years: 10.00    Types: Cigarettes    Last attempt to quit: 04/05/1974    Years since quitting: 43.0  . Smokeless tobacco: Never Used  . Tobacco comment: quit 40 years ago  Substance and Sexual  Activity  . Alcohol use: Yes    Alcohol/week: 0.0 oz    Comment: Socially  . Drug use: Not on file  . Sexual activity: Not on file

## 2017-04-29 ENCOUNTER — Ambulatory Visit (INDEPENDENT_AMBULATORY_CARE_PROVIDER_SITE_OTHER): Payer: Medicare Other | Admitting: Orthopaedic Surgery

## 2017-04-30 ENCOUNTER — Ambulatory Visit
Admission: RE | Admit: 2017-04-30 | Discharge: 2017-04-30 | Disposition: A | Discharge status: Home / Self Care | Payer: Medicare Other | Source: Ambulatory Visit | Attending: Orthopaedic Surgery | Admitting: Orthopaedic Surgery

## 2017-04-30 DIAGNOSIS — M25562 Pain in left knee: Principal | ICD-10-CM

## 2017-04-30 DIAGNOSIS — G8929 Other chronic pain: Secondary | ICD-10-CM

## 2017-04-30 DIAGNOSIS — M25511 Pain in right shoulder: Principal | ICD-10-CM

## 2017-05-07 ENCOUNTER — Encounter (INDEPENDENT_AMBULATORY_CARE_PROVIDER_SITE_OTHER): Payer: Self-pay | Admitting: Orthopaedic Surgery

## 2017-05-07 ENCOUNTER — Ambulatory Visit (INDEPENDENT_AMBULATORY_CARE_PROVIDER_SITE_OTHER): Payer: Medicare Other | Admitting: Orthopaedic Surgery

## 2017-05-07 VITALS — Ht 68.0 in | Wt 171.0 lb

## 2017-05-07 DIAGNOSIS — M23204 Derangement of unspecified medial meniscus due to old tear or injury, left knee: Secondary | ICD-10-CM | POA: Diagnosis not present

## 2017-05-07 DIAGNOSIS — M7541 Impingement syndrome of right shoulder: Secondary | ICD-10-CM | POA: Insufficient documentation

## 2017-05-07 DIAGNOSIS — M75111 Incomplete rotator cuff tear or rupture of right shoulder, not specified as traumatic: Secondary | ICD-10-CM | POA: Diagnosis not present

## 2017-05-07 DIAGNOSIS — M23342 Other meniscus derangements, anterior horn of lateral meniscus, left knee: Secondary | ICD-10-CM | POA: Diagnosis not present

## 2017-05-07 NOTE — Progress Notes (Signed)
Office Visit Note   Patient: Douglas Lindsey           Date of Birth: 09-06-1945           MRN: [id number] Visit Date: 05/07/2017              Requested by: Douglas Sacramento, MD [place], [place] [address] PCP: Douglas Sacramento, MD   Assessment & Plan: Visit Diagnoses:  1. Impingement syndrome of right shoulder   2. Degenerative tear of left medial meniscus   3. Derangement of anterior horn of lateral meniscus of left knee   4. Nontraumatic incomplete tear of rotator cuff, right     Plan: Impression is left knee medial and lateral meniscal tear with minimal degenerative arthritis.  I think his lateral meniscus tear is not really that significant based on MRI findings.  He does have a complex tear of the posterior horn the medial meniscus which would benefit from arthroscopic debridement.  Patient wants to try to get this done after the summer.  He will let us know when he is ready for this.  In terms of his shoulder he does have rotator cuff tendinosis with a partial articular surface supraspinatus tear without any retraction.  He does have biceps tendinopathy and moderate AC joint arthrosis with a type II acromion.  We also briefly discussed arthroscopic shoulder surgery.  He understands all this and he will let us know he is ready to proceed with the knee surgery  Follow-Up Instructions: Return if symptoms worsen or fail to improve.   Orders:  No orders of the defined types were placed in this encounter.  No orders of the defined types were placed in this encounter.     Procedures: No procedures performed   Clinical Data: No additional findings.   Subjective: Chief Complaint  Patient presents with  . Left Knee - Follow-up    MRI review  . Right Shoulder - Follow-up    Douglas Lindsey follows up today for his right shoulder and left knee MRI.  He recently received 3 cortisone injections in both shoulders and to his left knee at the [place].  This made him feel sick.   He continues to be symptomatic with his knee and shoulder.   Review of Systems  Constitutional: Negative.   All other systems reviewed and are negative.    Objective: Vital Signs: Ht 5\' 8"  (1.727 m)   Wt 171 lb (77.6 kg)   BMI 26.00 kg/m   Physical Exam  Constitutional: He is oriented to person, place, and time. He appears well-developed and well-nourished.  HENT:  Head: Normocephalic and atraumatic.  Eyes: Pupils are equal, round, and reactive to light.  Neck: Neck supple.  Pulmonary/Chest: Effort normal.  Abdominal: Soft.  Musculoskeletal: Normal range of motion.  Neurological: He is alert and oriented to person, place, and time.  Skin: Skin is warm.  Psychiatric: He has a normal mood and affect. His behavior is normal. Judgment and thought content normal.  Nursing note and vitals reviewed.   Ortho Exam Left knee exam shows no joint effusion.  Medial joint line tenderness. Right shoulder exam shows positive empty can test.  Positive impingement. Specialty Comments:  No specialty comments available.  Imaging: No results found.   PMFS History: Patient Active Problem List   Diagnosis Date Noted  . Degenerative tear of left medial meniscus 05/07/2017  . Derangement of anterior horn of lateral meniscus of left knee 05/07/2017  .  Impingement syndrome of right shoulder 05/07/2017  . HYPERLIPIDEMIA 12/24/2009  . HYPERTENSION 12/24/2009  . PALPITATIONS 12/24/2009   Past Medical History:  Diagnosis Date  . Chronic lower back pain   . Herpes simplex   . Hyperlipidemia   . Hypertension   . Sciatica     Family History  Problem Relation Age of Onset  . Lung cancer Mother   . Stroke Father     Past Surgical History:  Procedure Laterality Date  . LAPAROSCOPIC CHOLECYSTECTOMY    . MASTECTOMY    . SHOULDER SURGERY     Right   Social History   Occupational History  . Occupation: Full time - Administrator, sports: RETIRED  Tobacco Use  . Smoking  status: Former Smoker    Packs/day: 1.00    Years: 10.00    Pack years: 10.00    Types: Cigarettes    Last attempt to quit: 04/05/1974    Years since quitting: 43.1  . Smokeless tobacco: Never Used  . Tobacco comment: quit 40 years ago  Substance and Sexual Activity  . Alcohol use: Yes    Alcohol/week: 0.0 oz    Comment: Socially  . Drug use: Not on file  . Sexual activity: Not on file

## 2017-05-21 ENCOUNTER — Ambulatory Visit (INDEPENDENT_AMBULATORY_CARE_PROVIDER_SITE_OTHER): Payer: Medicare Other | Admitting: Physical Medicine and Rehabilitation

## 2017-05-21 ENCOUNTER — Encounter (INDEPENDENT_AMBULATORY_CARE_PROVIDER_SITE_OTHER): Payer: Self-pay | Admitting: Physical Medicine and Rehabilitation

## 2017-05-21 DIAGNOSIS — R202 Paresthesia of skin: Secondary | ICD-10-CM

## 2017-05-21 NOTE — Progress Notes (Signed)
 .  Numeric Pain Rating Scale and Functional Assessment Average Pain 7   In the last MONTH (on 0-10 scale) has pain interfered with the following?  1. General activity like being  able to carry out your everyday physical activities such as walking, climbing stairs, carrying groceries, or moving a chair?  Rating(2)   +Driver, -BT, -Dye Allergies.  

## 2017-05-25 NOTE — Progress Notes (Signed)
Douglas Lindsey - 72 y.o. male MRN [id number]  Date of birth: 1945/06/13  Office Visit Note: Visit Date: 05/21/2017 PCP: Douglas Sacramento, MD Referred by: Douglas Sacramento, MD  Subjective: Chief Complaint  Patient presents with  . Left Hand - Numbness   HPI: Douglas Lindsey is a 72 year old right-hand-dominant gentleman who comes in today at the request of Douglas Lindsey for evaluation with electrodiagnostic studies of the left upper extremity.  He reports nondermatomal left hand numbness particularly at night.  He says this is been going on for about a year.  He reports holding his hand up can make it worse and relaxing it makes it better.  He does have some difficulty using the phone or driving without handling will seem to get numb.  He denies any frank radicular pain.  He has been seeing Douglas Lindsey for his right shoulder and left knee.  He has had degenerative meniscal tears of the knee as well as impingement, AC joint arthritis and rotator cuff tendinosis of the right shoulder.  He evidently has had prior electrodiagnostic studies but we do not have those to look at.   ROS Otherwise per HPI.  Assessment & Plan: Visit Diagnoses:  1. Paresthesia of skin     Plan: No additional findings.  Impression: Essentially NORMAL electrodiagnostic study of the left upper limb.  There is no significant electrodiagnostic evidence of nerve entrapment, brachial plexopathy or cervical radiculopathy.    As you know, purely sensory or demyelinating radiculopathies and chemical radiculitis may not be detected with this particular electrodiagnostic study.   This electrodiagnostic study cannot rule out small fiber polyneuropathy and dysesthesias from central pain sensitization syndromes such as fibromyalgia.  Myotomal referral pain from trigger points is also not excluded.   Recommendations: 1.  Follow-up with referring physician. 2.  Continue current management of symptoms.   Meds & Orders: No orders of the defined  types were placed in this encounter.   Orders Placed This Encounter  Procedures  . NCV with EMG (electromyography)    Follow-up: Return for Douglas Lindsey.   Procedures: No procedures performed  EMG & NCV Findings: Evaluation of the left median motor nerve showed reduced amplitude (3.2 mV).  The left median (across palm) sensory nerve showed prolonged distal peak latency (Palm, 2.1 ms).  All remaining nerves (as indicated in the following tables) were within normal limits.    All examined muscles (as indicated in the following table) showed no evidence of electrical instability.    Impression: Essentially NORMAL electrodiagnostic study of the left upper limb.  There is no significant electrodiagnostic evidence of nerve entrapment, brachial plexopathy or cervical radiculopathy.    As you know, purely sensory or demyelinating radiculopathies and chemical radiculitis may not be detected with this particular electrodiagnostic study.   This electrodiagnostic study cannot rule out small fiber polyneuropathy and dysesthesias from central pain sensitization syndromes such as fibromyalgia.  Myotomal referral pain from trigger points is also not excluded.   Recommendations: 1.  Follow-up with referring physician. 2.  Continue current management of symptoms.   Nerve Conduction Studies Anti Sensory Summary Table   Stim Site NR Peak (ms) Norm Peak (ms) P-T Amp (V) Norm P-T Amp Site1 Site2 Delta-P (ms) Dist (cm) Vel (m/s) Norm Vel (m/s)  Left Median Acr Palm Anti Sensory (2nd Digit)  32.7C  Wrist    3.7 <3.8 19.6 >10 Wrist Palm 1.6 0.0    Palm    *2.1 <2.0 15.1  Left Radial Anti Sensory (Base 1st Digit)  33.3C  Wrist    2.1 <3.1 34.8  Wrist Base 1st Digit 2.1 0.0    Left Ulnar Anti Sensory (5th Digit)  33C  Wrist    3.4 <3.7 64.1 >15.0 Wrist 5th Digit 3.4 14.0 41 >38   Motor Summary Table   Stim Site NR Onset (ms) Norm Onset (ms) O-P Amp (mV) Norm O-P Amp Site1 Site2 Delta-0 (ms) Dist  (cm) Vel (m/s) Norm Vel (m/s)  Left Median Motor (Abd Poll Brev)  33.4C  Wrist    4.1 <4.2 *3.2 >5 Elbow Wrist 4.1 21.0 51 >50  Elbow    8.2  4.0         Left Ulnar Motor (Abd Dig Min)  33.2C  Wrist    2.9 <4.2 4.9 >3 B Elbow Wrist 3.5 21.0 60 >53  B Elbow    6.4  4.8  A Elbow B Elbow 1.4 9.0 64 >53  A Elbow    7.8  4.8          EMG   Side Muscle Nerve Root Ins Act Fibs Psw Amp Dur Poly Recrt Int Fraser Din Comment  Left Abd Poll Brev Median C8-T1 Nml Nml Nml Nml Nml 0 Nml Nml   Left 1stDorInt Ulnar C8-T1 Nml Nml Nml Nml Nml 0 Nml Nml   Left PronatorTeres Median C6-7 Nml Nml Nml Nml Nml 0 Nml Nml   Left Biceps Musculocut C5-6 Nml Nml Nml Nml Nml 0 Nml Nml   Left Deltoid Axillary C5-6 Nml Nml Nml Nml Nml 0 Nml Nml     Nerve Conduction Studies Anti Sensory Left/Right Comparison   Stim Site L Lat (ms) R Lat (ms) L-R Lat (ms) L Amp (V) R Amp (V) L-R Amp (%) Site1 Site2 L Vel (m/s) R Vel (m/s) L-R Vel (m/s)  Median Acr Palm Anti Sensory (2nd Digit)  32.7C  Wrist 3.7   19.6   Wrist Palm     Palm *2.1   15.1         Radial Anti Sensory (Base 1st Digit)  33.3C  Wrist 2.1   34.8   Wrist Base 1st Digit     Ulnar Anti Sensory (5th Digit)  33C  Wrist 3.4   64.1   Wrist 5th Digit 41     Motor Left/Right Comparison   Stim Site L Lat (ms) R Lat (ms) L-R Lat (ms) L Amp (mV) R Amp (mV) L-R Amp (%) Site1 Site2 L Vel (m/s) R Vel (m/s) L-R Vel (m/s)  Median Motor (Abd Poll Brev)  33.4C  Wrist 4.1   *3.2   Elbow Wrist 51    Elbow 8.2   4.0         Ulnar Motor (Abd Dig Min)  33.2C  Wrist 2.9   4.9   B Elbow Wrist 60    B Elbow 6.4   4.8   A Elbow B Elbow 64    A Elbow 7.8   4.8            Waveforms:            Clinical History: No specialty comments available.   He reports that he quit smoking about 43 years ago. His smoking use included cigarettes. He has a 10.00 pack-year smoking history. He has never used smokeless tobacco. No results for input(s): HGBA1C, LABURIC in the last 8760  hours.  Objective:  VS:  HT:    WT:   BMI:  BP:   HR: bpm  TEMP: ( )  RESP:  Physical Exam  Musculoskeletal:  Inspection reveals some osteoarthritis no atrophy of the bilateral APB or FDI or hand intrinsics. There is no swelling, color changes, allodynia or dystrophic changes. There is 5 out of 5 strength in the bilateral wrist extension, finger abduction and long finger flexion. There is intact sensation to light touch in all dermatomal and peripheral nerve distributions.  There is a negative Hoffmann's test bilaterally.    Ortho Exam Imaging: No results found.  Past Medical/Family/Surgical/Social History: Medications & Allergies reviewed per EMR, new medications updated. Patient Active Problem List   Diagnosis Date Noted  . Degenerative tear of left medial meniscus 05/07/2017  . Derangement of anterior horn of lateral meniscus of left knee 05/07/2017  . Impingement syndrome of right shoulder 05/07/2017  . HYPERLIPIDEMIA 12/24/2009  . HYPERTENSION 12/24/2009  . PALPITATIONS 12/24/2009   Past Medical History:  Diagnosis Date  . Chronic lower back pain   . Herpes simplex   . Hyperlipidemia   . Hypertension   . Sciatica    Family History  Problem Relation Age of Onset  . Lung cancer Mother   . Stroke Father    Past Surgical History:  Procedure Laterality Date  . LAPAROSCOPIC CHOLECYSTECTOMY    . MASTECTOMY    . SHOULDER SURGERY     Right   Social History   Occupational History  . Occupation: Full time - Administrator, sports: RETIRED  Tobacco Use  . Smoking status: Former Smoker    Packs/day: 1.00    Years: 10.00    Pack years: 10.00    Types: Cigarettes    Last attempt to quit: 04/05/1974    Years since quitting: 43.1  . Smokeless tobacco: Never Used  . Tobacco comment: quit 40 years ago  Substance and Sexual Activity  . Alcohol use: Yes    Alcohol/week: 0.0 oz    Comment: Socially  . Drug use: Not on file  . Sexual activity: Not on file

## 2017-05-25 NOTE — Procedures (Signed)
EMG & NCV Findings: Evaluation of the left median motor nerve showed reduced amplitude (3.2 mV).  The left median (across palm) sensory nerve showed prolonged distal peak latency (Palm, 2.1 ms).  All remaining nerves (as indicated in the following tables) were within normal limits.    All examined muscles (as indicated in the following table) showed no evidence of electrical instability.    Impression: Essentially NORMAL electrodiagnostic study of the left upper limb.  There is no significant electrodiagnostic evidence of nerve entrapment, brachial plexopathy or cervical radiculopathy.    As you know, purely sensory or demyelinating radiculopathies and chemical radiculitis may not be detected with this particular electrodiagnostic study.   This electrodiagnostic study cannot rule out small fiber polyneuropathy and dysesthesias from central pain sensitization syndromes such as fibromyalgia.  Myotomal referral pain from trigger points is also not excluded.   Recommendations: 1.  Follow-up with referring physician. 2.  Continue current management of symptoms.   Nerve Conduction Studies Anti Sensory Summary Table   Stim Site NR Peak (ms) Norm Peak (ms) P-T Amp (V) Norm P-T Amp Site1 Site2 Delta-P (ms) Dist (cm) Vel (m/s) Norm Vel (m/s)  Left Median Acr Palm Anti Sensory (2nd Digit)  32.7C  Wrist    3.7 <3.8 19.6 >10 Wrist Palm 1.6 0.0    Palm    *2.1 <2.0 15.1         Left Radial Anti Sensory (Base 1st Digit)  33.3C  Wrist    2.1 <3.1 34.8  Wrist Base 1st Digit 2.1 0.0    Left Ulnar Anti Sensory (5th Digit)  33C  Wrist    3.4 <3.7 64.1 >15.0 Wrist 5th Digit 3.4 14.0 41 >38   Motor Summary Table   Stim Site NR Onset (ms) Norm Onset (ms) O-P Amp (mV) Norm O-P Amp Site1 Site2 Delta-0 (ms) Dist (cm) Vel (m/s) Norm Vel (m/s)  Left Median Motor (Abd Poll Brev)  33.4C  Wrist    4.1 <4.2 *3.2 >5 Elbow Wrist 4.1 21.0 51 >50  Elbow    8.2  4.0         Left Ulnar Motor (Abd Dig Min)  33.2C   Wrist    2.9 <4.2 4.9 >3 B Elbow Wrist 3.5 21.0 60 >53  B Elbow    6.4  4.8  A Elbow B Elbow 1.4 9.0 64 >53  A Elbow    7.8  4.8          EMG   Side Muscle Nerve Root Ins Act Fibs Psw Amp Dur Poly Recrt Int Fraser Din Comment  Left Abd Poll Brev Median C8-T1 Nml Nml Nml Nml Nml 0 Nml Nml   Left 1stDorInt Ulnar C8-T1 Nml Nml Nml Nml Nml 0 Nml Nml   Left PronatorTeres Median C6-7 Nml Nml Nml Nml Nml 0 Nml Nml   Left Biceps Musculocut C5-6 Nml Nml Nml Nml Nml 0 Nml Nml   Left Deltoid Axillary C5-6 Nml Nml Nml Nml Nml 0 Nml Nml     Nerve Conduction Studies Anti Sensory Left/Right Comparison   Stim Site L Lat (ms) R Lat (ms) L-R Lat (ms) L Amp (V) R Amp (V) L-R Amp (%) Site1 Site2 L Vel (m/s) R Vel (m/s) L-R Vel (m/s)  Median Acr Palm Anti Sensory (2nd Digit)  32.7C  Wrist 3.7   19.6   Wrist Palm     Palm *2.1   15.1         Radial Anti Sensory (Base 1st  Digit)  33.3C  Wrist 2.1   34.8   Wrist Base 1st Digit     Ulnar Anti Sensory (5th Digit)  33C  Wrist 3.4   64.1   Wrist 5th Digit 41     Motor Left/Right Comparison   Stim Site L Lat (ms) R Lat (ms) L-R Lat (ms) L Amp (mV) R Amp (mV) L-R Amp (%) Site1 Site2 L Vel (m/s) R Vel (m/s) L-R Vel (m/s)  Median Motor (Abd Poll Brev)  33.4C  Wrist 4.1   *3.2   Elbow Wrist 51    Elbow 8.2   4.0         Ulnar Motor (Abd Dig Min)  33.2C  Wrist 2.9   4.9   B Elbow Wrist 60    B Elbow 6.4   4.8   A Elbow B Elbow 64    A Elbow 7.8   4.8            Waveforms:

## 2017-05-31 ENCOUNTER — Encounter (INDEPENDENT_AMBULATORY_CARE_PROVIDER_SITE_OTHER): Payer: Self-pay | Admitting: Orthopaedic Surgery

## 2017-05-31 ENCOUNTER — Ambulatory Visit (INDEPENDENT_AMBULATORY_CARE_PROVIDER_SITE_OTHER): Payer: Medicare Other | Admitting: Orthopaedic Surgery

## 2017-05-31 ENCOUNTER — Ambulatory Visit (INDEPENDENT_AMBULATORY_CARE_PROVIDER_SITE_OTHER): Payer: Medicare Other

## 2017-05-31 DIAGNOSIS — M25562 Pain in left knee: Secondary | ICD-10-CM

## 2017-05-31 DIAGNOSIS — G8929 Other chronic pain: Secondary | ICD-10-CM

## 2017-05-31 DIAGNOSIS — M25511 Pain in right shoulder: Secondary | ICD-10-CM | POA: Diagnosis not present

## 2017-05-31 DIAGNOSIS — M542 Cervicalgia: Secondary | ICD-10-CM

## 2017-05-31 MED ORDER — LIDOCAINE HCL 1 % IJ SOLN
2.0000 mL | INTRAMUSCULAR | Status: AC | PRN
  Administered 2017-05-31: 2 mL

## 2017-05-31 MED ORDER — LIDOCAINE HCL 2 % IJ SOLN
2.0000 mL | INTRAMUSCULAR | Status: AC | PRN
  Administered 2017-05-31: 2 mL

## 2017-05-31 MED ORDER — BUPIVACAINE HCL 0.25 % IJ SOLN
2.0000 mL | INTRAMUSCULAR | Status: AC | PRN
  Administered 2017-05-31: 2 mL via INTRA_ARTICULAR

## 2017-05-31 MED ORDER — METHYLPREDNISOLONE ACETATE 40 MG/ML IJ SUSP
40.0000 mg | INTRAMUSCULAR | Status: AC | PRN
  Administered 2017-05-31: 40 mg via INTRA_ARTICULAR

## 2017-05-31 MED ORDER — METHYLPREDNISOLONE 4 MG PO TBPK: ORAL_TABLET | ORAL | 0 refills | Status: DC

## 2017-05-31 NOTE — Progress Notes (Signed)
Office Visit Note   Patient: Douglas Lindsey           Date of Birth: 06-13-1945           MRN: [id number] Visit Date: 05/31/2017              Requested by: Christain Sacramento, MD [place], [place] [address] PCP: Christain Sacramento, MD   Assessment & Plan: Visit Diagnoses:  1. Chronic pain of left knee   2. Neck pain     Plan: Impression is left knee meniscus tear.  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  Cervical spine radiculopathy.  Today, we will inject the left knee and right shoulder subacromial space with cortisone.  I will also call in a prescription for a steroid taper to start should the right shoulder cortisone injection failed to improve his symptoms.  He will give this 2 weeks before he starts the taper.  He will follow-up with [place] in late June or early July to schedule surgical intervention in August for the right shoulder.  Call with concerns or questions in the meantime  Total face to face encounter time was greater than 25 minutes and over half of this time was spent in counseling and/or coordination of care.  Follow-Up Instructions: Return in about 7 weeks (around 07/19/2017).   Orders:  Orders Placed This Encounter  Procedures  . Large Joint Inj: R subacromial bursa  . Large Joint Inj: L knee  . XR Cervical Spine 2 or 3 views   Meds ordered this encounter  Medications  . methylPREDNISolone (MEDROL DOSEPAK) 4 MG TBPK tablet    Sig: Take as directed    Dispense:  21 tablet    Refill:  0      Procedures: Large Joint Inj: R subacromial bursa on 05/31/2017 10:29 AM Indications: pain Details: 22 G needle Medications: 2 mL bupivacaine 0.25 %; 2 mL lidocaine 2 %; 40 mg methylPREDNISolone acetate 40 MG/ML Outcome: tolerated well, no immediate complications Patient was prepped and draped in the usual sterile fashion.   Large Joint Inj: L knee on 05/31/2017 10:29 AM Indications: pain Details: 22 G needle, anterolateral  approach Medications: 2 mL lidocaine 1 %; 2 mL bupivacaine 0.25 %; 40 mg methylPREDNISolone acetate 40 MG/ML      Clinical Data: No additional findings.   Subjective: Chief Complaint  Patient presents with  . Left Knee - Pain  . Right Shoulder - Pain    HPI patient is a pleasant 72 year old gentleman who presents to our clinic today with bilateral shoulder and knee pain.  He does have a history of right shoulder partial rotator cuff tear based on recent MRI findings.  He does note a previous cortisone injection from the [place], but this was several months ago.  He also has a recent MRI of his left knee which shows a moderate medial meniscus tear.  He has been trying to put off surgery until the fall.  He is now noting numbness and tingling going down to the right hand and into the index and long fingers throughout the day, but worse at night.  He has similar symptoms on the left and is recently had a nerve conduction study.  This was negative.  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  Review of Systems as detailed in HPI.  All others are negative   Objective: Vital Signs: There were no  vitals taken for this visit.  Physical Exam well-developed and well-nourished gentleman in no acute distress.  Alert and oriented x3.  Ortho Exam stable shoulder and knee exams.  Is unable to do a Phalen's test due to previous  fusion to the right hand.  Positive Tinel on the right.  Specialty Comments:  No specialty comments available.  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    PMFS History: Patient Active Problem List   Diagnosis Date Noted  . Degenerative tear of left medial meniscus 05/07/2017  . Derangement of anterior horn of lateral meniscus of left knee 05/07/2017  . Impingement syndrome of right shoulder 05/07/2017  .  HYPERLIPIDEMIA 12/24/2009  . HYPERTENSION 12/24/2009  . PALPITATIONS 12/24/2009   Past Medical History:  Diagnosis Date  . Chronic lower back pain   . Herpes simplex   . Hyperlipidemia   . Hypertension   . Sciatica     Family History  Problem Relation Age of Onset  . Lung cancer Mother   . Stroke Father     Past Surgical History:  Procedure Laterality Date  . LAPAROSCOPIC CHOLECYSTECTOMY    . MASTECTOMY    . SHOULDER SURGERY     Right   Social History   Occupational History  . Occupation: Full time - Administrator, sports: RETIRED  Tobacco Use  . Smoking status: Former Smoker    Packs/day: 1.00    Years: 10.00    Pack years: 10.00    Types: Cigarettes    Last attempt to quit: 04/05/1974    Years since quitting: 43.1  . Smokeless tobacco: Never Used  . Tobacco comment: quit 40 years ago  Substance and Sexual Activity  . Alcohol use: Yes    Alcohol/week: 0.0 oz    Comment: Socially  . Drug use: Not on file  . Sexual activity: Not on file

## 2017-06-01 ENCOUNTER — Ambulatory Visit (INDEPENDENT_AMBULATORY_CARE_PROVIDER_SITE_OTHER): Payer: Medicare Other | Admitting: Orthopaedic Surgery

## 2017-07-20 ENCOUNTER — Ambulatory Visit (INDEPENDENT_AMBULATORY_CARE_PROVIDER_SITE_OTHER): Payer: Medicare Other | Admitting: Orthopaedic Surgery

## 2017-07-20 DIAGNOSIS — M5412 Radiculopathy, cervical region: Secondary | ICD-10-CM | POA: Diagnosis not present

## 2017-07-20 MED ORDER — GABAPENTIN 100 MG PO CAPS: 100.0000 mg | ORAL_CAPSULE | Freq: Three times a day (TID) | ORAL | 3 refills | Status: DC

## 2017-07-20 NOTE — Progress Notes (Signed)
   Office Visit Note   Patient: Douglas Lindsey           Date of Birth: 1945-10-13           MRN: [id number] Visit Date: 07/20/2017              Requested by: Christain Sacramento, MD [place], [place] [address] PCP: Christain Sacramento, MD   Assessment & Plan: Visit Diagnoses:  1. Cervical radiculopathy     Plan: At this point we will await the results of the cervical spine MRI from the [place].  I have prescribed him gabapentin to see if this will help regulate his nerve pain.  Follow-up as needed.  Follow-Up Instructions: Return if symptoms worsen or fail to improve.   Orders:  No orders of the defined types were placed in this encounter.  Meds ordered this encounter  Medications  . gabapentin (NEURONTIN) 100 MG capsule    Sig: Take 1-2 capsules (100-200 mg total) by mouth 3 (three) times daily.    Dispense:  30 capsule    Refill:  3      Procedures: No procedures performed   Clinical Data: No additional findings.   Subjective: Chief Complaint  Patient presents with  . Right Hand - Pain, Numbness    Douglas Lindsey follows up today for continued right hand burning and pain.  He has an MRI scheduled for the Sunday through the [place].  Previous nerve conduction studies are normal.   Review of Systems   Objective: Vital Signs: There were no vitals taken for this visit.  Physical Exam  Ortho Exam Exam is stable. Specialty Comments:  No specialty comments available.  Imaging: No results found.   PMFS History: Patient Active Problem List   Diagnosis Date Noted  . Degenerative tear of left medial meniscus 05/07/2017  . Derangement of anterior horn of lateral meniscus of left knee 05/07/2017  . Impingement syndrome of right shoulder 05/07/2017  . HYPERLIPIDEMIA 12/24/2009  . HYPERTENSION 12/24/2009  . PALPITATIONS 12/24/2009   Past Medical History:  Diagnosis Date  . Chronic lower back pain   . Herpes simplex   . Hyperlipidemia   . Hypertension    . Sciatica     Family History  Problem Relation Age of Onset  . Lung cancer Mother   . Stroke Father     Past Surgical History:  Procedure Laterality Date  . LAPAROSCOPIC CHOLECYSTECTOMY    . MASTECTOMY    . SHOULDER SURGERY     Right   Social History   Occupational History  . Occupation: Full time - Administrator, sports: RETIRED  Tobacco Use  . Smoking status: Former Smoker    Packs/day: 1.00    Years: 10.00    Pack years: 10.00    Types: Cigarettes    Last attempt to quit: 04/05/1974    Years since quitting: 43.3  . Smokeless tobacco: Never Used  . Tobacco comment: quit 40 years ago  Substance and Sexual Activity  . Alcohol use: Yes    Alcohol/week: 0.0 oz    Comment: Socially  . Drug use: Not on file  . Sexual activity: Not on file

## 2017-07-26 ENCOUNTER — Telehealth (INDEPENDENT_AMBULATORY_CARE_PROVIDER_SITE_OTHER): Payer: Self-pay | Admitting: Orthopaedic Surgery

## 2017-07-26 NOTE — Telephone Encounter (Signed)
Take advil 800 mg tid for the next few days

## 2017-07-26 NOTE — Telephone Encounter (Signed)
Patient called asked what can he do in the meantime about the burning,swelling and pain in his right hand? Patient has an appointment 07/27/17 at 3:45 with Dr Erlinda Hong.   The number to contact patient is [phone]

## 2017-07-26 NOTE — Telephone Encounter (Signed)
Please advise 

## 2017-07-27 ENCOUNTER — Encounter (INDEPENDENT_AMBULATORY_CARE_PROVIDER_SITE_OTHER): Payer: Self-pay | Admitting: Orthopaedic Surgery

## 2017-07-27 ENCOUNTER — Ambulatory Visit (INDEPENDENT_AMBULATORY_CARE_PROVIDER_SITE_OTHER): Payer: Medicare Other | Admitting: Orthopaedic Surgery

## 2017-07-27 DIAGNOSIS — M5412 Radiculopathy, cervical region: Secondary | ICD-10-CM | POA: Diagnosis not present

## 2017-07-27 NOTE — Progress Notes (Signed)
     Patient: Douglas Lindsey           Date of Birth: August 24, 1945           MRN: [id number] Visit Date: 07/27/2017 PCP: Christain Sacramento, MD   Assessment & Plan:  Chief Complaint:  Chief Complaint  Patient presents with  . Right Hand - Pain   Visit Diagnoses:  1. Cervical radiculopathy     Plan: Patient is a pleasant 72 year old gentleman who presents our clinic today to discuss MRI results of the cervical spine.  MRI results show severe bilateral neural foraminal narrowing at C5-6.  The patient remains symptomatic and would like to proceed with epidural steroid injection.  We will set this up with Dr. Ernestina Patches.  We will also refer him to Dr. Kathyrn Sheriff as he would like a neurosurgery consult as well.  He will follow-up with [place] as needed.  Follow-Up Instructions: Return if symptoms worsen or fail to improve.   Orders:  Orders Placed This Encounter  Procedures  . Ambulatory referral to Physical Medicine Rehab  . Ambulatory referral to Neurosurgery   No orders of the defined types were placed in this encounter.   Imaging: No results found.  PMFS History: Patient Active Problem List   Diagnosis Date Noted  . Degenerative tear of left medial meniscus 05/07/2017  . Derangement of anterior horn of lateral meniscus of left knee 05/07/2017  . Impingement syndrome of right shoulder 05/07/2017  . HYPERLIPIDEMIA 12/24/2009  . HYPERTENSION 12/24/2009  . PALPITATIONS 12/24/2009   Past Medical History:  Diagnosis Date  . Chronic lower back pain   . Herpes simplex   . Hyperlipidemia   . Hypertension   . Sciatica     Family History  Problem Relation Age of Onset  . Lung cancer Mother   . Stroke Father     Past Surgical History:  Procedure Laterality Date  . LAPAROSCOPIC CHOLECYSTECTOMY    . MASTECTOMY    . SHOULDER SURGERY     Right   Social History   Occupational History  . Occupation: Full time - Administrator, sports: RETIRED  Tobacco Use  . Smoking status:  Former Smoker    Packs/day: 1.00    Years: 10.00    Pack years: 10.00    Types: Cigarettes    Last attempt to quit: 04/05/1974    Years since quitting: 43.3  . Smokeless tobacco: Never Used  . Tobacco comment: quit 40 years ago  Substance and Sexual Activity  . Alcohol use: Yes    Alcohol/week: 0.0 oz    Comment: Socially  . Drug use: Not on file  . Sexual activity: Not on file

## 2017-07-27 NOTE — Telephone Encounter (Signed)
Patient came into office today.

## 2017-08-02 ENCOUNTER — Telehealth (INDEPENDENT_AMBULATORY_CARE_PROVIDER_SITE_OTHER): Payer: Self-pay | Admitting: *Deleted

## 2017-08-02 NOTE — Telephone Encounter (Signed)
That's fine.  He can come in for a IM toradol injection

## 2017-08-02 NOTE — Telephone Encounter (Signed)
Please advise 

## 2017-08-03 ENCOUNTER — Encounter (INDEPENDENT_AMBULATORY_CARE_PROVIDER_SITE_OTHER): Payer: Self-pay | Admitting: Orthopaedic Surgery

## 2017-08-03 ENCOUNTER — Ambulatory Visit (INDEPENDENT_AMBULATORY_CARE_PROVIDER_SITE_OTHER): Payer: Medicare Other | Admitting: Orthopaedic Surgery

## 2017-08-03 ENCOUNTER — Ambulatory Visit (INDEPENDENT_AMBULATORY_CARE_PROVIDER_SITE_OTHER): Payer: Medicare Other

## 2017-08-03 DIAGNOSIS — M5412 Radiculopathy, cervical region: Secondary | ICD-10-CM | POA: Diagnosis not present

## 2017-08-03 NOTE — Telephone Encounter (Signed)
Called patient he will come in this PM

## 2017-08-03 NOTE — Progress Notes (Signed)
Nurse visit for IM toradol injection

## 2017-08-06 ENCOUNTER — Telehealth (INDEPENDENT_AMBULATORY_CARE_PROVIDER_SITE_OTHER): Payer: Self-pay | Admitting: Orthopaedic Surgery

## 2017-08-06 ENCOUNTER — Other Ambulatory Visit (INDEPENDENT_AMBULATORY_CARE_PROVIDER_SITE_OTHER): Payer: Self-pay

## 2017-08-06 DIAGNOSIS — M79641 Pain in right hand: Secondary | ICD-10-CM

## 2017-08-06 NOTE — Telephone Encounter (Signed)
Sure. NCV evaluate for CTS

## 2017-08-06 NOTE — Telephone Encounter (Signed)
Please advise 

## 2017-08-06 NOTE — Telephone Encounter (Signed)
Sent order in for patient

## 2017-08-06 NOTE — Telephone Encounter (Signed)
Patient called asked for a call back concerning his right hand. Patient wanted to discuss having a procedure done where a needle is injected into the hand for a nerve test. The number to contact patient is [phone]

## 2017-08-10 ENCOUNTER — Other Ambulatory Visit (INDEPENDENT_AMBULATORY_CARE_PROVIDER_SITE_OTHER): Payer: Self-pay | Admitting: Orthopaedic Surgery

## 2017-08-10 MED ORDER — GABAPENTIN 100 MG PO CAPS: 100.0000 mg | ORAL_CAPSULE | Freq: Three times a day (TID) | ORAL | 3 refills | Status: DC

## 2017-08-10 NOTE — Telephone Encounter (Signed)
Rx sent in to pharm 

## 2017-08-10 NOTE — Telephone Encounter (Signed)
Patient requesting rx refill on gabapentin. Patients # [phone]

## 2017-08-10 NOTE — Telephone Encounter (Signed)
Yes

## 2017-08-10 NOTE — Telephone Encounter (Signed)
Please advise 

## 2017-08-19 ENCOUNTER — Ambulatory Visit (INDEPENDENT_AMBULATORY_CARE_PROVIDER_SITE_OTHER): Payer: Self-pay

## 2017-08-19 ENCOUNTER — Ambulatory Visit (INDEPENDENT_AMBULATORY_CARE_PROVIDER_SITE_OTHER): Payer: Medicare Other | Admitting: Physical Medicine and Rehabilitation

## 2017-08-19 ENCOUNTER — Encounter (INDEPENDENT_AMBULATORY_CARE_PROVIDER_SITE_OTHER): Payer: Self-pay | Admitting: Physical Medicine and Rehabilitation

## 2017-08-19 ENCOUNTER — Encounter

## 2017-08-19 VITALS — BP 143/87 | HR 56

## 2017-08-19 DIAGNOSIS — M5412 Radiculopathy, cervical region: Secondary | ICD-10-CM

## 2017-08-19 MED ORDER — METHYLPREDNISOLONE ACETATE 80 MG/ML IJ SUSP
80.0000 mg | Freq: Once | INTRAMUSCULAR | Status: AC
  Administered 2017-08-19: 80 mg

## 2017-08-19 NOTE — Progress Notes (Signed)
Pt states pain in both shoulders that radiates in the right hand with some numbness in right hand. Pt states pain started 6 weeks ago. Pt states pain comes and goes, ice helps with pain.   .Numeric Pain Rating Scale and Functional Assessment Average Pain 9   In the last MONTH (on 0-10 scale) has pain interfered with the following?  1. General activity like being  able to carry out your everyday physical activities such as walking, climbing stairs, carrying groceries, or moving a chair?  Rating(5)   +Driver, -BT, -Dye Allergies.

## 2017-08-19 NOTE — Progress Notes (Signed)
Douglas Lindsey - 72 y.o. male MRN [id number]  Date of birth: Jun 14, 1945  Office Visit Note: Visit Date: 08/19/2017 PCP: Douglas Sacramento, MD Referred by: Douglas Sacramento, MD  Subjective: Chief Complaint  Douglas Lindsey presents with  . Right Shoulder - Pain  . Left Shoulder - Pain  . Right Hand - Pain   HPI: Douglas Lindsey is a 72 year old right-hand-dominant gentleman that I have seen back in April for electrodiagnostic study.  We are going to discuss Douglas electrodiagnostic study in Douglas second.  Since I have seen him Douglas Lindsey is undergone MRI of Douglas cervical spine.  This was done at Douglas Lindsey  interim.  I did review Douglas MRI imaging and this shows multilevel spondylosis with degenerative disc height loss and some increased kyphosis with by foraminal narrowing at C5-6.  His pain is so she is having bilateral shoulder pain right worse than left.  Douglas Lindsey is getting hand numbness and tingling in Douglas right hand and a pretty classic median nerve distribution in Douglas first second third and radial side of Douglas fourth digit.  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  Douglas Lindsey does not have frank radicular symptoms going down Douglas arm but seems to have separate shoulder and hand pain.  Since I have seen him Douglas Lindsey has been wearing a compression glove which is 1 of these copper gloves and feels like this is helped to a degree.  It is helped to Douglas point where Douglas Lindsey did wean down off of Douglas gabapentin to a lower dose.  Douglas Lindsey has also seen Douglas Lindsey.  Those notes were reviewed as well and Douglas Lindsey felt like this was probably not a radicular symptom because Douglas Lindsey was not really having anything down Douglas arm but had separate shoulder and hand symptoms.  Douglas Lindsey did want Douglas Lindsey to call him a week after cervical epidural injection which was already scheduled with [place]  today.  In terms of Douglas electrodiagnostic study it is interesting because Douglas report details Douglas left side electrodiagnostic study.  When we go back and look at Douglas day that Douglas Lindsey came in Douglas request from Dr. Wyline Lindsey was for bilateral upper limbs.  When you look at Douglas notes prior to that visit Douglas Lindsey was having bilateral hand pain and not real specific median neuropathy type symptoms.  On Douglas day we saw him we are usually pretty careful to do Douglas side that has symptoms in particular and if there is even mild symptoms on Douglas other side we tend to do that side as well particularly if in order is for both sides.  Douglas Lindsey's memory is not well but Douglas Lindsey feels like we did complete a study on Douglas right.  Clearly Douglas print out only shows left-sided data.  There could have been some machine glitch with Douglas EMG machine where we only saved left-sided data.  Occasionally Douglas Lindsey will come in though Douglas symptoms are clearly one-sided and is on Douglas side but now it is not Douglas symptomatic side.  Nonetheless Douglas prior study on Douglas left was negative and did not show any nerve compression or cervical radiculopathy.  Douglas Lindsey is scheduled to complete a right-sided nerve study on August 2.  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   ROS Otherwise per HPI.  Assessment & Plan: Visit Diagnoses:  1. Cervical radiculopathy     Plan: No additional findings.   Meds & Orders:  Meds ordered this encounter  Medications  . methylPREDNISolone acetate (DEPO-MEDROL) injection 80 mg    Orders Placed This Encounter  Procedures  . XR C-ARM NO REPORT  . Epidural Steroid injection    Follow-up: Return if symptoms worsen or fail to improve.   Procedures: No procedures performed  Cervical Epidural Steroid Injection - Interlaminar Approach with Fluoroscopic Guidance  Douglas Lindsey: Douglas Lindsey      Date of Birth: 12-Oct-1945 MRN: [id number] PCP: Douglas Sacramento,  MD      Visit Date: 08/19/2017   Universal Protocol:    Date/Time: 08/19/1909:30 AM  Consent Given By: Douglas Lindsey  Position: PRONE  Additional Comments: Vital signs were monitored before and after Douglas procedure. Douglas Lindsey was prepped and draped in Douglas usual sterile fashion. Douglas correct Douglas Lindsey, procedure, and site was verified.   Injection Procedure Details:  Procedure Site One Meds Administered:  Meds ordered this encounter  Medications  . methylPREDNISolone acetate (DEPO-MEDROL) injection 80 mg     Laterality: Right  Location/Site: C7-T1  Needle size: 20 G  Needle type: Touhy  Needle Placement: Paramedian epidural space  Findings:  -Comments: Excellent flow of contrast into Douglas epidural space.  Procedure Details: Using a paramedian approach from Douglas side mentioned above, Douglas region overlying Douglas inferior lamina was localized under fluoroscopic visualization and Douglas soft tissues overlying this structure were infiltrated with 4 ml. of 1% Lidocaine without Epinephrine. A # 20 gauge, Tuohy needle was inserted into Douglas epidural space using a paramedian approach.  Douglas epidural space was localized using loss of resistance along with lateral and contralateral oblique bi-planar fluoroscopic views.  After negative aspirate for air, blood, and CSF, a 2 ml. volume of Isovue-250 was injected into Douglas epidural space and Douglas flow of contrast was observed. Radiographs were obtained for documentation purposes.   Douglas injectate was administered into Douglas level noted above.  Additional Comments:  Douglas Lindsey tolerated Douglas procedure well Dressing: Band-Aid    Post-procedure details: Douglas Lindsey was observed during Douglas procedure. Post-procedure instructions were reviewed.  Douglas Lindsey left Douglas clinic in stable condition.    Clinical History: No specialty comments available.   Douglas Lindsey reports that Douglas Lindsey quit smoking about 43 years ago. His smoking use included cigarettes. Douglas Lindsey has a 10.00 pack-year  smoking history. Douglas Lindsey has never used smokeless tobacco. No results for input(s): HGBA1C, LABURIC in Douglas last 8760 hours.  Objective:  VS:  HT:    WT:   BMI:     BP:(!) 143/87  HR:(!) 56bpm  TEMP: ( )  RESP:  Physical Exam  Ortho Exam Imaging: Xr C-arm No Report  Result Date: 08/19/2017 Please see Notes tab for imaging impression.   Past Medical/Family/Surgical/Social History: Medications & Allergies reviewed per EMR, new medications updated. Douglas Lindsey Active Problem List   Diagnosis Date Noted  . Degenerative tear of left medial meniscus 05/07/2017  . Derangement of anterior horn of lateral meniscus of left knee 05/07/2017  . Impingement syndrome of right shoulder 05/07/2017  . HYPERLIPIDEMIA 12/24/2009  . HYPERTENSION 12/24/2009  . PALPITATIONS 12/24/2009   Past Medical History:  Diagnosis Date  . Chronic lower back pain   . Herpes simplex   . Hyperlipidemia   . Hypertension   . Sciatica    Family History  Problem Relation Age of Onset  .  Lung cancer Mother   . Stroke Father    Past Surgical History:  Procedure Laterality Date  . LAPAROSCOPIC CHOLECYSTECTOMY    . MASTECTOMY    . SHOULDER SURGERY     Right   Social History   Occupational History  . Occupation: Full time - Administrator, sports: RETIRED  Tobacco Use  . Smoking status: Former Smoker    Packs/day: 1.00    Years: 10.00    Pack years: 10.00    Types: Cigarettes    Last attempt to quit: 04/05/1974    Years since quitting: 43.4  . Smokeless tobacco: Never Used  . Tobacco comment: quit 40 years ago  Substance and Sexual Activity  . Alcohol use: Yes    Alcohol/week: 0.0 oz    Comment: Socially  . Drug use: Not on file  . Sexual activity: Not on file

## 2017-08-19 NOTE — Patient Instructions (Signed)

## 2017-08-19 NOTE — Procedures (Signed)
Cervical Epidural Steroid Injection - Interlaminar Approach with Fluoroscopic Guidance  Patient: Douglas Lindsey      Date of Birth: May 08, 1945 MRN: [id number] PCP: Christain Sacramento, MD      Visit Date: 08/19/2017   Universal Protocol:    Date/Time: 08/19/1909:30 AM  Consent Given By: the patient  Position: PRONE  Additional Comments: Vital signs were monitored before and after the procedure. Patient was prepped and draped in the usual sterile fashion. The correct patient, procedure, and site was verified.   Injection Procedure Details:  Procedure Site One Meds Administered:  Meds ordered this encounter  Medications  . methylPREDNISolone acetate (DEPO-MEDROL) injection 80 mg     Laterality: Right  Location/Site: C7-T1  Needle size: 20 G  Needle type: Touhy  Needle Placement: Paramedian epidural space  Findings:  -Comments: Excellent flow of contrast into the epidural space.  Procedure Details: Using a paramedian approach from the side mentioned above, the region overlying the inferior lamina was localized under fluoroscopic visualization and the soft tissues overlying this structure were infiltrated with 4 ml. of 1% Lidocaine without Epinephrine. A # 20 gauge, Tuohy needle was inserted into the epidural space using a paramedian approach.  The epidural space was localized using loss of resistance along with lateral and contralateral oblique bi-planar fluoroscopic views.  After negative aspirate for air, blood, and CSF, a 2 ml. volume of Isovue-250 was injected into the epidural space and the flow of contrast was observed. Radiographs were obtained for documentation purposes.   The injectate was administered into the level noted above.  Additional Comments:  The patient tolerated the procedure well Dressing: Band-Aid    Post-procedure details: Patient was observed during the procedure. Post-procedure instructions were reviewed.  Patient left the clinic in stable  condition.

## 2017-08-27 ENCOUNTER — Ambulatory Visit (INDEPENDENT_AMBULATORY_CARE_PROVIDER_SITE_OTHER): Payer: Medicare Other | Admitting: Physical Medicine and Rehabilitation

## 2017-08-27 DIAGNOSIS — R202 Paresthesia of skin: Secondary | ICD-10-CM

## 2017-08-27 NOTE — Progress Notes (Signed)
   Numeric Pain Rating Scale and Functional Assessment Average Pain 6   In the last MONTH (on 0-10 scale) has pain interfered with the following?  1. General activity like being  able to carry out your everyday physical activities such as walking, climbing stairs, carrying groceries, or moving a chair?  Rating(8)    

## 2017-08-30 NOTE — Progress Notes (Signed)
Douglas Lindsey - 72 y.o. male MRN [id number]  Date of birth: 1945/10/23  Office Visit Note: Visit Date: 08/27/2017 PCP: Christain Sacramento, MD Referred by: Christain Sacramento, MD  Subjective: Chief Complaint  Patient presents with  . Right Wrist - Pain    Numbness in fingers   HPI: Douglas Lindsey is a 72 year old right-hand-dominant gentleman who comes in today at the request of Dr. Eduard Roux and is being co-treated by Dr. Kathyrn Sheriff at [facility].  His story is somewhat interesting and perplexing.  He originally saw Dr. Erlinda Hong and March of this year with chronic shoulder pain as well as hand pain and numbness bilaterally.  Electrodiagnostic study was performed by myself on the patient which was of the left upper extremity and this was essentially normal.  I have reviewed the notes from that day from both the staff and myself and the patient was complaining mainly of left sided symptoms and not right.  I even rechecked the electrodiagnostic study equipment and database in the left side was completed and on the right side.  My only thought on this is that may be at the time the patient misunderstood [place] asking questions about his pain and he reported only the pain she was having at that moment and not routinely.  Nonetheless left-sided study was completed which was normal.  Patient went on to have cervical MRI which is reviewed below.  This did show some significant findings, ironically, and patient was seen by Dr. Kathyrn Sheriff.  It appears from reading the notes that this declared itself and a pretty classic median nerve distribution on the right and on the right only.  He does get occasional tingling on the left.  He gets tingling numbness in a classic right median nerve distribution in digits 1 through 4 and it is the radial side of digit 4 and not the ulnar side.  We have also seen the patient recently for cervical epidural injection which also ironically gave him some relief of his  hand pain.  He reports that he also gets some relief with wearing a copper glove on the right.   ROS Otherwise per HPI.  Assessment & Plan: Visit Diagnoses:  1. Paresthesia of skin     Plan: No additional findings.  Impression: The above electrodiagnostic study is ABNORMAL and reveals evidence of a severe right median nerve entrapment at the wrist (carpal tunnel syndrome) affecting sensory and motor components. **Prognostically with fairly normal needle EMG and lack of significant atrophy the patient should make pretty good recovery with proper decompression.  The patient could have some residual decreased sensation to touch given the axonal damage.  Lastly, there may be some level of conduction block which also would bode well for recovery when sufficiently decompressed.   There is no significant electrodiagnostic evidence of any other focal nerve entrapment, brachial plexopathy or cervical radiculopathy.   Recommendations: 1.  Follow-up with referring physician. 2.  Continue current management of symptoms. 3.  Suggest surgical evaluation.   Meds & Orders: No orders of the defined types were placed in this encounter.   Orders Placed This Encounter  Procedures  . NCV with EMG (electromyography)    Follow-up: Return for Dr. Eduard Roux.   Procedures: No procedures performed  EMG & NCV Findings: Evaluation of the right median motor nerve showed reduced amplitude (0.4 mV) and decreased conduction velocity (Elbow-Wrist, 39 m/s).  The right median (across palm) sensory nerve showed no response (Wrist)  and prolonged distal peak latency (Palm, 6.9 ms).  All remaining nerves (as indicated in the following tables) were within normal limits.    All examined muscles (as indicated in the following table) showed no evidence of electrical instability.    Impression: The above electrodiagnostic study is ABNORMAL and reveals evidence of a severe right median nerve entrapment at the wrist (carpal  tunnel syndrome) affecting sensory and motor components. **Prognostically with fairly normal needle EMG and lack of significant atrophy the patient should make pretty good recovery with proper decompression.  The patient could have some residual decreased sensation to touch given the axonal damage.  Lastly, there may be some level of conduction block which also would bode well for recovery when sufficiently decompressed.   There is no significant electrodiagnostic evidence of any other focal nerve entrapment, brachial plexopathy or cervical radiculopathy.   Recommendations: 1.  Follow-up with referring physician. 2.  Continue current management of symptoms. 3.  Suggest surgical evaluation.  ___________________________ [name] FAAPMR Board Certified, American Board of Physical Medicine and Rehabilitation    Nerve Conduction Studies Anti Sensory Summary Table   Stim Site NR Peak (ms) Norm Peak (ms) P-T Amp (V) Norm P-T Amp Site1 Site2 Delta-P (ms) Dist (cm) Vel (m/s) Norm Vel (m/s)  Right Median Acr Palm Anti Sensory (2nd Digit)  33.8C  Wrist *NR  <3.6  >10 Wrist Palm  0.0    Palm    *6.9 <2.0 1.9         Right Radial Anti Sensory (Base 1st Digit)  32.9C  Wrist    2.3 <3.1 17.4  Wrist Base 1st Digit 2.3 0.0    Right Ulnar Anti Sensory (5th Digit)  34.4C  Wrist    3.3 <3.7 23.7 >15.0 Wrist 5th Digit 3.3 14.0 42 >38   Motor Summary Table   Stim Site NR Onset (ms) Norm Onset (ms) O-P Amp (mV) Norm O-P Amp Site1 Site2 Delta-0 (ms) Dist (cm) Vel (m/s) Norm Vel (m/s)  Right Median Motor (Abd Poll Brev)  33.8C  Wrist    4.1 <4.2 *0.4 >5 Elbow Wrist 5.7 22.0 *39 >50  Elbow    9.8  2.6         Right Ulnar Motor (Abd Dig Min)  32.9C  Wrist    3.0 <4.2 8.2 >3 B Elbow Wrist 3.6 21.0 58 >53  B Elbow    6.6  7.6  A Elbow B Elbow 1.5 9.0 60 >53  A Elbow    8.1  7.9          EMG   Side Muscle Nerve Root Ins Act Fibs Psw Amp Dur Poly Recrt Int Fraser Din Comment  Right Abd Poll Brev Median  C8-T1 Nml Nml Nml Nml Nml 0 Nml Nml   Right 1stDorInt Ulnar C8-T1 Nml Nml Nml Nml Nml 0 Nml Nml   Right PronatorTeres Median C6-7 Nml Nml Nml Nml Nml 0 Nml Nml   Right Biceps Musculocut C5-6 Nml Nml Nml Nml Nml 0 Nml Nml   Right Deltoid Axillary C5-6 Nml Nml Nml Nml Nml 0 Nml Nml     Nerve Conduction Studies Anti Sensory Left/Right Comparison   Stim Site L Lat (ms) R Lat (ms) L-R Lat (ms) L Amp (V) R Amp (V) L-R Amp (%) Site1 Site2 L Vel (m/s) R Vel (m/s) L-R Vel (m/s)  Median Acr Palm Anti Sensory (2nd Digit)  33.8C  Wrist       Wrist Palm  Palm  *6.9   1.9        Radial Anti Sensory (Base 1st Digit)  32.9C  Wrist  2.3   17.4  Wrist Base 1st Digit     Ulnar Anti Sensory (5th Digit)  34.4C  Wrist  3.3   23.7  Wrist 5th Digit  42    Motor Left/Right Comparison   Stim Site L Lat (ms) R Lat (ms) L-R Lat (ms) L Amp (mV) R Amp (mV) L-R Amp (%) Site1 Site2 L Vel (m/s) R Vel (m/s) L-R Vel (m/s)  Median Motor (Abd Poll Brev)  33.8C  Wrist  4.1   *0.4  Elbow Wrist  *39   Elbow  9.8   2.6        Ulnar Motor (Abd Dig Min)  32.9C  Wrist  3.0   8.2  B Elbow Wrist  58   B Elbow  6.6   7.6  A Elbow B Elbow  60   A Elbow  8.1   7.9           Waveforms:            Clinical History: No specialty comments available.   He reports that he quit smoking about 43 years ago. His smoking use included cigarettes. He has a 10.00 pack-year smoking history. He has never used smokeless tobacco. No results for input(s): HGBA1C, LABURIC in the last 8760 hours.  Objective:  VS:  HT:    WT:   BMI:     BP:   HR: bpm  TEMP: ( )  RESP:  Physical Exam  Musculoskeletal:  Inspection reveals no atrophy of the bilateral APB or FDI or hand intrinsics. There is no swelling, color changes, allodynia or dystrophic changes. There is 5 out of 5 strength in the bilateral wrist extension, finger abduction and long finger flexion. There is decreased sensation to light touch in the right median peripheral  nerve distribution.  There is a negative Hoffmann's test bilaterally.    Ortho Exam Imaging: No results found.  Past Medical/Family/Surgical/Social History: Medications & Allergies reviewed per EMR, new medications updated. Patient Active Problem List   Diagnosis Date Noted  . Degenerative tear of left medial meniscus 05/07/2017  . Derangement of anterior horn of lateral meniscus of left knee 05/07/2017  . Impingement syndrome of right shoulder 05/07/2017  . HYPERLIPIDEMIA 12/24/2009  . HYPERTENSION 12/24/2009  . PALPITATIONS 12/24/2009   Past Medical History:  Diagnosis Date  . Chronic lower back pain   . Herpes simplex   . Hyperlipidemia   . Hypertension   . Sciatica    Family History  Problem Relation Age of Onset  . Lung cancer Mother   . Stroke Father    Past Surgical History:  Procedure Laterality Date  . LAPAROSCOPIC CHOLECYSTECTOMY    . MASTECTOMY    . SHOULDER SURGERY     Right   Social History   Occupational History  . Occupation: Full time - Administrator, sports: RETIRED  Tobacco Use  . Smoking status: Former Smoker    Packs/day: 1.00    Years: 10.00    Pack years: 10.00    Types: Cigarettes    Last attempt to quit: 04/05/1974    Years since quitting: 43.4  . Smokeless tobacco: Never Used  . Tobacco comment: quit 40 years ago  Substance and Sexual Activity  . Alcohol use: Yes    Alcohol/week: 0.0 oz    Comment: Socially  .  Drug use: Not on file  . Sexual activity: Not on file

## 2017-08-30 NOTE — Procedures (Signed)
EMG & NCV Findings: Evaluation of the right median motor nerve showed reduced amplitude (0.4 mV) and decreased conduction velocity (Elbow-Wrist, 39 m/s).  The right median (across palm) sensory nerve showed no response (Wrist) and prolonged distal peak latency (Palm, 6.9 ms).  All remaining nerves (as indicated in the following tables) were within normal limits.    All examined muscles (as indicated in the following table) showed no evidence of electrical instability.    Impression: The above electrodiagnostic study is ABNORMAL and reveals evidence of a severe right median nerve entrapment at the wrist (carpal tunnel syndrome) affecting sensory and motor components. **Prognostically with fairly normal needle EMG and lack of significant atrophy the patient should make pretty good recovery with proper decompression.  The patient could have some residual decreased sensation to touch given the axonal damage.  Lastly, there may be some level of conduction block which also would bode well for recovery when sufficiently decompressed.   There is no significant electrodiagnostic evidence of any other focal nerve entrapment, brachial plexopathy or cervical radiculopathy.   Recommendations: 1.  Follow-up with referring physician. 2.  Continue current management of symptoms. 3.  Suggest surgical evaluation.  ___________________________ [name] FAAPMR Board Certified, American Board of Physical Medicine and Rehabilitation    Nerve Conduction Studies Anti Sensory Summary Table   Stim Site NR Peak (ms) Norm Peak (ms) P-T Amp (V) Norm P-T Amp Site1 Site2 Delta-P (ms) Dist (cm) Vel (m/s) Norm Vel (m/s)  Right Median Acr Palm Anti Sensory (2nd Digit)  33.8C  Wrist *NR  <3.6  >10 Wrist Palm  0.0    Palm    *6.9 <2.0 1.9         Right Radial Anti Sensory (Base 1st Digit)  32.9C  Wrist    2.3 <3.1 17.4  Wrist Base 1st Digit 2.3 0.0    Right Ulnar Anti Sensory (5th Digit)  34.4C  Wrist    3.3 <3.7  23.7 >15.0 Wrist 5th Digit 3.3 14.0 42 >38   Motor Summary Table   Stim Site NR Onset (ms) Norm Onset (ms) O-P Amp (mV) Norm O-P Amp Site1 Site2 Delta-0 (ms) Dist (cm) Vel (m/s) Norm Vel (m/s)  Right Median Motor (Abd Poll Brev)  33.8C  Wrist    4.1 <4.2 *0.4 >5 Elbow Wrist 5.7 22.0 *39 >50  Elbow    9.8  2.6         Right Ulnar Motor (Abd Dig Min)  32.9C  Wrist    3.0 <4.2 8.2 >3 B Elbow Wrist 3.6 21.0 58 >53  B Elbow    6.6  7.6  A Elbow B Elbow 1.5 9.0 60 >53  A Elbow    8.1  7.9          EMG   Side Muscle Nerve Root Ins Act Fibs Psw Amp Dur Poly Recrt Int Fraser Din Comment  Right Abd Poll Brev Median C8-T1 Nml Nml Nml Nml Nml 0 Nml Nml   Right 1stDorInt Ulnar C8-T1 Nml Nml Nml Nml Nml 0 Nml Nml   Right PronatorTeres Median C6-7 Nml Nml Nml Nml Nml 0 Nml Nml   Right Biceps Musculocut C5-6 Nml Nml Nml Nml Nml 0 Nml Nml   Right Deltoid Axillary C5-6 Nml Nml Nml Nml Nml 0 Nml Nml     Nerve Conduction Studies Anti Sensory Left/Right Comparison   Stim Site L Lat (ms) R Lat (ms) L-R Lat (ms) L Amp (V) R Amp (V) L-R Amp (%) Site1  Site2 L Vel (m/s) R Vel (m/s) L-R Vel (m/s)  Median Acr Palm Anti Sensory (2nd Digit)  33.8C  Wrist       Wrist Palm     Palm  *6.9   1.9        Radial Anti Sensory (Base 1st Digit)  32.9C  Wrist  2.3   17.4  Wrist Base 1st Digit     Ulnar Anti Sensory (5th Digit)  34.4C  Wrist  3.3   23.7  Wrist 5th Digit  42    Motor Left/Right Comparison   Stim Site L Lat (ms) R Lat (ms) L-R Lat (ms) L Amp (mV) R Amp (mV) L-R Amp (%) Site1 Site2 L Vel (m/s) R Vel (m/s) L-R Vel (m/s)  Median Motor (Abd Poll Brev)  33.8C  Wrist  4.1   *0.4  Elbow Wrist  *39   Elbow  9.8   2.6        Ulnar Motor (Abd Dig Min)  32.9C  Wrist  3.0   8.2  B Elbow Wrist  58   B Elbow  6.6   7.6  A Elbow B Elbow  60   A Elbow  8.1   7.9           Waveforms:

## 2017-08-31 ENCOUNTER — Encounter (INDEPENDENT_AMBULATORY_CARE_PROVIDER_SITE_OTHER): Payer: Self-pay | Admitting: Physician Assistant

## 2017-08-31 ENCOUNTER — Other Ambulatory Visit (INDEPENDENT_AMBULATORY_CARE_PROVIDER_SITE_OTHER): Payer: Self-pay | Admitting: Physician Assistant

## 2017-08-31 ENCOUNTER — Ambulatory Visit (INDEPENDENT_AMBULATORY_CARE_PROVIDER_SITE_OTHER): Payer: Medicare Other | Admitting: Physician Assistant

## 2017-08-31 DIAGNOSIS — G5601 Carpal tunnel syndrome, right upper limb: Secondary | ICD-10-CM | POA: Diagnosis not present

## 2017-08-31 DIAGNOSIS — M25561 Pain in right knee: Secondary | ICD-10-CM

## 2017-08-31 DIAGNOSIS — M25562 Pain in left knee: Secondary | ICD-10-CM | POA: Diagnosis not present

## 2017-08-31 DIAGNOSIS — G8929 Other chronic pain: Secondary | ICD-10-CM

## 2017-08-31 MED ORDER — METHYLPREDNISOLONE ACETATE 40 MG/ML IJ SUSP
40.0000 mg | INTRAMUSCULAR | Status: AC | PRN
  Administered 2017-08-31: 40 mg via INTRA_ARTICULAR

## 2017-08-31 MED ORDER — GABAPENTIN 100 MG PO CAPS: 100.0000 mg | ORAL_CAPSULE | Freq: Three times a day (TID) | ORAL | 3 refills | Status: AC

## 2017-08-31 MED ORDER — BUPIVACAINE HCL 0.25 % IJ SOLN
2.0000 mL | INTRAMUSCULAR | Status: AC | PRN
  Administered 2017-08-31: 2 mL via INTRA_ARTICULAR

## 2017-08-31 MED ORDER — LIDOCAINE HCL 1 % IJ SOLN
2.0000 mL | INTRAMUSCULAR | Status: AC | PRN
  Administered 2017-08-31: 2 mL

## 2017-08-31 NOTE — Progress Notes (Addendum)
Office Visit Note   Patient: Douglas Lindsey           Date of Birth: Oct 28, 1945           MRN: [id number] Visit Date: 08/31/2017              Requested by: Christain Sacramento, MD [place], [place] [id number] PCP: Christain Sacramento, MD   Assessment & Plan: Visit Diagnoses:  1. Carpal tunnel syndrome, right upper limb   2. Chronic pain of right knee     Plan: Impression is right carpal tunnel syndrome and right knee osteoarthritis flareup.  In regards to the right wrist, we will proceed with surgical intervention to include a right carpal tunnel release.  Risks, benefits and possible occasions reviewed.  Rehab and recovery time discussed.  All questions were answered.  In regards to the right knee, we will reinject this with cortisone.  He will rest, ice and elevate as much as possible over the next few days.  Call with concerns or questions.  Follow-Up Instructions: Return for 2 weeks post-op.   Orders:  Orders Placed This Encounter  Procedures  . Large Joint Inj: R knee   Meds ordered this encounter  Medications  . bupivacaine (MARCAINE) 0.25 % (with pres) injection 2 mL  . lidocaine (XYLOCAINE) 1 % (with pres) injection 2 mL  . methylPREDNISolone acetate (DEPO-MEDROL) injection 40 mg      Procedures: Large Joint Inj: R knee on 08/31/2017 10:15 AM Indications: pain Details: 22 G needle, anterolateral approach Medications: 2 mL lidocaine 1 %; 2 mL bupivacaine 0.25 %; 40 mg methylPREDNISolone acetate 40 MG/ML      Clinical Data: No additional findings.   Subjective: No chief complaint on file.   HPI patient is a pleasant 72 year old  gentleman who presents to our clinic today to review nerve conduction studies of the right upper extremity.  Nerve conduction studies of the right upper extremity reveals severe median nerve entrapment.  The patient is symptomatic and has tried conservative treatment options to include a removable wrist splint.  These have  been of no relief.  He would like to proceed with surgical intervention.  Other issue he brings up today is his left knee.  History of arthritis.  Cortisone injection in the past which significantly helped.  He would like another 1 of these today if possible.  Review of Systems as detailed in HPI.  All others reviewed and are negative.   Objective: Vital Signs: There were no vitals taken for this visit.  Physical Exam well-developed and well-nourished gentleman in no acute distress.  Alert and oriented x3.  Ortho Exam examination of the right wrist reveals positive Phalen and Tinel.  No thenar atrophy.  Stable knee exam.  Specialty Comments:  No specialty comments available.  Imaging: No new imaging   PMFS History: Patient Active Problem List   Diagnosis Date Noted  . Chronic pain of left knee 08/31/2017  . Carpal tunnel syndrome, right upper limb 08/31/2017  . Degenerative tear of left medial meniscus 05/07/2017  . Derangement of anterior horn of lateral meniscus of left knee 05/07/2017  . Impingement syndrome of right shoulder 05/07/2017  . HYPERLIPIDEMIA 12/24/2009  . HYPERTENSION 12/24/2009  . PALPITATIONS 12/24/2009   Past Medical History:  Diagnosis Date  . Chronic lower back pain   . Herpes simplex   . Hyperlipidemia   . Hypertension   . Sciatica     Family History  Problem Relation Age of Onset  . Lung cancer Mother   . Stroke Father     Past Surgical History:  Procedure Laterality Date  . LAPAROSCOPIC CHOLECYSTECTOMY    . MASTECTOMY    . SHOULDER SURGERY     Right   Social History   Occupational History  . Occupation: Full time - Administrator, sports: RETIRED  Tobacco Use  . Smoking status: Former Smoker    Packs/day: 1.00    Years: 10.00    Pack years: 10.00    Types: Cigarettes    Last attempt to quit: 04/05/1974    Years since quitting: 43.4  . Smokeless tobacco: Never Used  . Tobacco comment: quit 40 years ago  Substance and Sexual  Activity  . Alcohol use: Yes    Alcohol/week: 0.0 oz    Comment: Socially  . Drug use: Not on file  . Sexual activity: Not on file

## 2017-09-16 ENCOUNTER — Other Ambulatory Visit: Payer: Self-pay

## 2017-09-16 ENCOUNTER — Encounter (HOSPITAL_BASED_OUTPATIENT_CLINIC_OR_DEPARTMENT_OTHER): Payer: Self-pay | Admitting: *Deleted

## 2017-09-20 ENCOUNTER — Telehealth (INDEPENDENT_AMBULATORY_CARE_PROVIDER_SITE_OTHER): Payer: Self-pay | Admitting: Orthopaedic Surgery

## 2017-09-20 NOTE — Telephone Encounter (Signed)
Should not

## 2017-09-20 NOTE — Telephone Encounter (Signed)
Patient said he had blood work done and his white blood cell count was abnormal. He is scheduled to have surgery 8/28. He wants to know how this will affect him having it? Please advise # [phone]

## 2017-09-20 NOTE — Telephone Encounter (Signed)
Called to advise,  

## 2017-09-20 NOTE — Telephone Encounter (Signed)
See message below,

## 2017-09-22 ENCOUNTER — Encounter (HOSPITAL_BASED_OUTPATIENT_CLINIC_OR_DEPARTMENT_OTHER)
Admission: RE | Disposition: A | Discharge status: Home / Self Care | Payer: Self-pay | Source: Ambulatory Visit | Attending: Orthopaedic Surgery

## 2017-09-22 ENCOUNTER — Encounter (HOSPITAL_BASED_OUTPATIENT_CLINIC_OR_DEPARTMENT_OTHER): Payer: Self-pay | Admitting: Anesthesiology

## 2017-09-22 ENCOUNTER — Other Ambulatory Visit: Payer: Self-pay

## 2017-09-22 ENCOUNTER — Ambulatory Visit (HOSPITAL_BASED_OUTPATIENT_CLINIC_OR_DEPARTMENT_OTHER): Payer: Medicare Other | Admitting: Anesthesiology

## 2017-09-22 ENCOUNTER — Ambulatory Visit (HOSPITAL_BASED_OUTPATIENT_CLINIC_OR_DEPARTMENT_OTHER)
Admission: RE | Admit: 2017-09-22 | Discharge: 2017-09-22 | Disposition: A | Discharge status: Home / Self Care | Payer: Medicare Other | Source: Ambulatory Visit | Attending: Orthopaedic Surgery | Admitting: Orthopaedic Surgery

## 2017-09-22 DIAGNOSIS — Z7982 Long term (current) use of aspirin: Secondary | ICD-10-CM | POA: Insufficient documentation

## 2017-09-22 DIAGNOSIS — G5601 Carpal tunnel syndrome, right upper limb: Secondary | ICD-10-CM

## 2017-09-22 DIAGNOSIS — Z87891 Personal history of nicotine dependence: Secondary | ICD-10-CM | POA: Insufficient documentation

## 2017-09-22 DIAGNOSIS — Z79899 Other long term (current) drug therapy: Secondary | ICD-10-CM | POA: Insufficient documentation

## 2017-09-22 DIAGNOSIS — Z885 Allergy status to narcotic agent status: Secondary | ICD-10-CM | POA: Diagnosis not present

## 2017-09-22 DIAGNOSIS — Z823 Family history of stroke: Secondary | ICD-10-CM | POA: Insufficient documentation

## 2017-09-22 DIAGNOSIS — Z888 Allergy status to other drugs, medicaments and biological substances status: Secondary | ICD-10-CM | POA: Diagnosis not present

## 2017-09-22 DIAGNOSIS — E785 Hyperlipidemia, unspecified: Secondary | ICD-10-CM | POA: Insufficient documentation

## 2017-09-22 DIAGNOSIS — I10 Essential (primary) hypertension: Secondary | ICD-10-CM | POA: Insufficient documentation

## 2017-09-22 DIAGNOSIS — K219 Gastro-esophageal reflux disease without esophagitis: Secondary | ICD-10-CM | POA: Insufficient documentation

## 2017-09-22 HISTORY — PX: CARPAL TUNNEL RELEASE: SHX101

## 2017-09-22 HISTORY — DX: Gastro-esophageal reflux disease without esophagitis: K21.9

## 2017-09-22 SURGERY — CARPAL TUNNEL RELEASE
Anesthesia: Monitor Anesthesia Care | Site: Wrist | Laterality: Right

## 2017-09-22 MED ORDER — ONDANSETRON HCL 4 MG/2ML IJ SOLN
INTRAMUSCULAR | Status: DC | PRN
  Administered 2017-09-22: 4 mg via INTRAVENOUS

## 2017-09-22 MED ORDER — PROPOFOL 500 MG/50ML IV EMUL
INTRAVENOUS | Status: AC
  Filled 2017-09-22: qty 50

## 2017-09-22 MED ORDER — PROPOFOL 500 MG/50ML IV EMUL
INTRAVENOUS | Status: DC | PRN
  Administered 2017-09-22: 25 ug/kg/min via INTRAVENOUS

## 2017-09-22 MED ORDER — HYDROCODONE-ACETAMINOPHEN 5-325 MG PO TABS: 1.0000 | ORAL_TABLET | Freq: Four times a day (QID) | ORAL | 0 refills | Status: AC | PRN

## 2017-09-22 MED ORDER — LIDOCAINE HCL (PF) 0.5 % IJ SOLN
INTRAMUSCULAR | Status: DC | PRN
  Administered 2017-09-22: 50 mL via INTRAVENOUS

## 2017-09-22 MED ORDER — LACTATED RINGERS IV SOLN
INTRAVENOUS | Status: DC
  Administered 2017-09-22: 08:00:00 via INTRAVENOUS

## 2017-09-22 MED ORDER — CHLORHEXIDINE GLUCONATE 4 % EX LIQD: 60.0000 mL | Freq: Once | CUTANEOUS | Status: DC

## 2017-09-22 MED ORDER — ONDANSETRON HCL 4 MG PO TABS: 4.0000 mg | ORAL_TABLET | Freq: Three times a day (TID) | ORAL | 0 refills | Status: AC | PRN

## 2017-09-22 MED ORDER — FENTANYL CITRATE (PF) 100 MCG/2ML IJ SOLN
INTRAMUSCULAR | Status: AC
  Filled 2017-09-22: qty 2

## 2017-09-22 MED ORDER — SCOPOLAMINE 1 MG/3DAYS TD PT72: 1.0000 | MEDICATED_PATCH | Freq: Once | TRANSDERMAL | Status: DC | PRN

## 2017-09-22 MED ORDER — FENTANYL CITRATE (PF) 100 MCG/2ML IJ SOLN: 50.0000 ug | INTRAMUSCULAR | Status: DC | PRN

## 2017-09-22 MED ORDER — BUPIVACAINE HCL (PF) 0.25 % IJ SOLN
INTRAMUSCULAR | Status: AC
  Filled 2017-09-22: qty 30

## 2017-09-22 MED ORDER — BUPIVACAINE-EPINEPHRINE (PF) 0.25% -1:200000 IJ SOLN
INTRAMUSCULAR | Status: AC
  Filled 2017-09-22: qty 30

## 2017-09-22 MED ORDER — FENTANYL CITRATE (PF) 100 MCG/2ML IJ SOLN
INTRAMUSCULAR | Status: DC | PRN
  Administered 2017-09-22 (×2): 50 ug via INTRAVENOUS

## 2017-09-22 MED ORDER — LIDOCAINE 2% (20 MG/ML) 5 ML SYRINGE
INTRAMUSCULAR | Status: AC
  Filled 2017-09-22: qty 5

## 2017-09-22 MED ORDER — ONDANSETRON HCL 4 MG/2ML IJ SOLN
INTRAMUSCULAR | Status: AC
  Filled 2017-09-22: qty 2

## 2017-09-22 MED ORDER — CEFAZOLIN SODIUM-DEXTROSE 2-4 GM/100ML-% IV SOLN
2.0000 g | INTRAVENOUS | Status: AC
  Administered 2017-09-22: 2 g via INTRAVENOUS

## 2017-09-22 MED ORDER — ONDANSETRON HCL 4 MG/2ML IJ SOLN: 4.0000 mg | Freq: Once | INTRAMUSCULAR | Status: DC | PRN

## 2017-09-22 MED ORDER — BUPIVACAINE-EPINEPHRINE 0.25% -1:200000 IJ SOLN
INTRAMUSCULAR | Status: DC | PRN
  Administered 2017-09-22: 10 mL

## 2017-09-22 MED ORDER — MIDAZOLAM HCL 2 MG/2ML IJ SOLN: 1.0000 mg | INTRAMUSCULAR | Status: DC | PRN

## 2017-09-22 MED ORDER — FENTANYL CITRATE (PF) 100 MCG/2ML IJ SOLN: 25.0000 ug | INTRAMUSCULAR | Status: DC | PRN

## 2017-09-22 SURGICAL SUPPLY — 51 items
BANDAGE ACE 3X5.8 VEL STRL LF (GAUZE/BANDAGES/DRESSINGS) ×3 IMPLANT
BLADE MINI RND TIP GREEN BEAV (BLADE) ×3 IMPLANT
BLADE SURG 15 STRL LF DISP TIS (BLADE) ×1 IMPLANT
BLADE SURG 15 STRL SS (BLADE) ×3
BNDG CMPR 9X4 STRL LF SNTH (GAUZE/BANDAGES/DRESSINGS) ×1
BNDG ESMARK 4X9 LF (GAUZE/BANDAGES/DRESSINGS) ×3 IMPLANT
BNDG PLASTER X FAST 3X3 WHT LF (CAST SUPPLIES) ×30 IMPLANT
BNDG PLSTR 9X3 FST ST WHT (CAST SUPPLIES) ×10
BRUSH SCRUB EZ PLAIN DRY (MISCELLANEOUS) ×1 IMPLANT
CANISTER SUCT 1200ML W/VALVE (MISCELLANEOUS) ×3 IMPLANT
CORD BIPOLAR FORCEPS 12FT (ELECTRODE) ×3 IMPLANT
COVER BACK TABLE 60X90IN (DRAPES) ×3 IMPLANT
COVER MAYO STAND STRL (DRAPES) ×3 IMPLANT
CUFF TOURNIQUET SINGLE 18IN (TOURNIQUET CUFF) IMPLANT
DECANTER SPIKE VIAL GLASS SM (MISCELLANEOUS) IMPLANT
DRAPE EXTREMITY T 121X128X90 (DRAPE) ×3 IMPLANT
DRAPE IMP U-DRAPE 54X76 (DRAPES) ×3 IMPLANT
DRAPE SURG 17X23 STRL (DRAPES) ×3 IMPLANT
GAUZE 4X4 16PLY RFD (DISPOSABLE) IMPLANT
GAUZE SPONGE 4X4 12PLY STRL (GAUZE/BANDAGES/DRESSINGS) ×3 IMPLANT
GAUZE XEROFORM 1X8 LF (GAUZE/BANDAGES/DRESSINGS) ×3 IMPLANT
GLOVE BIOGEL M 7.0 STRL (GLOVE) ×2 IMPLANT
GLOVE BIOGEL PI IND STRL 7.0 (GLOVE) ×1 IMPLANT
GLOVE BIOGEL PI IND STRL 8 (GLOVE) IMPLANT
GLOVE BIOGEL PI INDICATOR 7.0 (GLOVE) ×4
GLOVE BIOGEL PI INDICATOR 8 (GLOVE) ×2
GLOVE ECLIPSE 7.0 STRL STRAW (GLOVE) ×3 IMPLANT
GLOVE SKINSENSE NS SZ7.5 (GLOVE) ×2
GLOVE SKINSENSE STRL SZ7.5 (GLOVE) ×1 IMPLANT
GLOVE SURG SYN 7.5  E (GLOVE) ×2
GLOVE SURG SYN 7.5 E (GLOVE) ×1 IMPLANT
GLOVE SURG SYN 7.5 PF PI (GLOVE) ×1 IMPLANT
GOWN STRL REIN XL XLG (GOWN DISPOSABLE) ×3 IMPLANT
GOWN STRL REUS W/ TWL XL LVL3 (GOWN DISPOSABLE) ×2 IMPLANT
GOWN STRL REUS W/TWL XL LVL3 (GOWN DISPOSABLE) ×6
NDL HYPO 25X1 1.5 SAFETY (NEEDLE) IMPLANT
NEEDLE HYPO 25X1 1.5 SAFETY (NEEDLE) IMPLANT
NS IRRIG 1000ML POUR BTL (IV SOLUTION) ×3 IMPLANT
PACK BASIN DAY SURGERY FS (CUSTOM PROCEDURE TRAY) ×3 IMPLANT
PAD CAST 3X4 CTTN HI CHSV (CAST SUPPLIES) ×1 IMPLANT
PADDING CAST COTTON 3X4 STRL (CAST SUPPLIES) ×3
RUBBERBAND STERILE (MISCELLANEOUS) ×6 IMPLANT
STOCKINETTE 4X48 STRL (DRAPES) ×3 IMPLANT
SUT ETHILON 3 0 PS 1 (SUTURE) ×3 IMPLANT
SYR BULB 3OZ (MISCELLANEOUS) ×3 IMPLANT
SYR CONTROL 10ML LL (SYRINGE) ×2 IMPLANT
TOWEL GREEN STERILE FF (TOWEL DISPOSABLE) ×3 IMPLANT
TRAY DSU PREP LF (CUSTOM PROCEDURE TRAY) ×3 IMPLANT
TUBE CONNECTING 20'X1/4 (TUBING)
TUBE CONNECTING 20X1/4 (TUBING) IMPLANT
UNDERPAD 30X30 (UNDERPADS AND DIAPERS) ×3 IMPLANT

## 2017-09-22 NOTE — Op Note (Signed)
   Carpal tunnel op note  DATE OF SURGERY:09/22/2017  PREOPERATIVE DIAGNOSIS:  Right carpal tunnel syndrome  POSTOPERATIVE DIAGNOSIS: same  PROCEDURE:  Right carpal tunnel release. CPT [phone]  SURGEON: Surgeon(s): Leandrew Koyanagi, MD  ASSIST: Madalyn Rob, PA-C; necessary for the timely completion of procedure and due to complexity of procedure.  ANESTHESIA:  Regional  TOURNIQUET TIME: less than 10 minutes  BLOOD LOSS: Minimal.  COMPLICATIONS: None.  PATHOLOGY: None.  INDICATIONS: The patient is a 72 y.o. -year-old male who presented with carpal tunnel syndrome failing nonsurgical management, indicated for surgical release.  DESCRIPTION OF PROCEDURE: The patient was identified in the preoperative holding area.  The operative site was marked by the surgeon and confirmed by the patient.  He was brought back to the operating room.  Anesthesia was induced by the anesthesia team.  A well padded nonsterile tourniquet was placed. The operative extremity was prepped and draped in standard sterile fashion.  A timeout was performed.  Preoperative antibiotics were given.   A palmar incision was made about 5 mm ulnar to the thenar crease.  The palmar aponeurosis was exposed and divided in line with the skin incision. The palmaris brevis was visualized and divided.  The distal edge of the transcarpal ligament was identified. A hemostat was inserted into the carpal tunnel to protect the median nerve and the flexor tendons. Then, the transverse carpal ligament was released under direct visualization. Proximally, a subcutaneous tunnel was made allowing a Sewell retractor to be placed. Then, the distal portion of the antebrachial fascia was released. Distally, all fibrous bands were released. The median nerve was visualized, and the fat pad was exposed. Following release, local infiltration with 0.25% of Sensorcaine was given. The tourniquet was deflated. Hemostasis achieved.  Wound was irrigated  and closed with 4-0 nylon sutures. Sterile dressing applied. The patient was transferred to the recovery room in stable condition after all counts were correct.  POSTOPERATIVE PLAN: To start nerve gliding exercises as tolerated and no heavy lifting for four weeks.

## 2017-09-22 NOTE — Transfer of Care (Signed)
Immediate Anesthesia Transfer of Care Note  Patient: Douglas Lindsey  Procedure(s) Performed: RIGHT CARPAL TUNNEL RELEASE (Right Wrist)  Patient Location: PACU  Anesthesia Type:Bier block  Level of Consciousness: awake and patient cooperative  Airway & Oxygen Therapy: Patient Spontanous Breathing and Patient connected to face mask oxygen  Post-op Assessment: Report given to RN and Post -op Vital signs reviewed and stable  Post vital signs: Reviewed and stable  Last Vitals:  Vitals Value Taken Time  BP    Temp    Pulse 77 09/22/2017  9:20 AM  Resp 17 09/22/2017  9:20 AM  SpO2 97 % 09/22/2017  9:20 AM  Vitals shown include unvalidated device data.  Last Pain:  Vitals:   09/22/17 0827  TempSrc: Oral  PainSc: 0-No pain         Complications: No apparent anesthesia complications

## 2017-09-22 NOTE — Anesthesia Procedure Notes (Signed)
Procedure Name: MAC Date/Time: 09/22/2017 8:40 AM Performed by: Marrianne Mood, CRNA Pre-anesthesia Checklist: Patient identified, Timeout performed, Emergency Drugs available and Suction available Patient Re-evaluated:Patient Re-evaluated prior to induction Oxygen Delivery Method: Simple face mask Preoxygenation: Pre-oxygenation with 100% oxygen

## 2017-09-22 NOTE — Anesthesia Preprocedure Evaluation (Addendum)
Anesthesia Evaluation  Patient identified by MRN, date of birth, ID band Patient awake    Reviewed: Allergy & Precautions, NPO status , Patient's Chart, lab work & pertinent test results, reviewed documented beta blocker date and time   Airway Mallampati: II  TM Distance: <3 FB Neck ROM: Full    Dental  (+) Teeth Intact, Dental Advisory Given   Pulmonary former smoker,    Pulmonary exam normal breath sounds clear to auscultation       Cardiovascular hypertension, Pt. on home beta blockers and Pt. on medications Normal cardiovascular exam Rhythm:Regular Rate:Normal     Neuro/Psych Right carpal tunnel syndrome  Neuromuscular disease negative psych ROS   GI/Hepatic GERD  Medicated,  Endo/Other  negative endocrine ROS  Renal/GU negative Renal ROS     Musculoskeletal  (+) Arthritis ,   Abdominal   Peds  Hematology negative hematology ROS (+)   Anesthesia Other Findings Day of surgery medications reviewed with the patient.  Reproductive/Obstetrics                            Anesthesia Physical Anesthesia Plan  ASA: II  Anesthesia Plan: MAC and Bier Block and Bier Block-LIDOCAINE ONLY   Post-op Pain Management:    Induction: Intravenous  PONV Risk Score and Plan: 1 and Propofol infusion  Airway Management Planned: Nasal Cannula  Additional Equipment:   Intra-op Plan:   Post-operative Plan:   Informed Consent: I have reviewed the patients History and Physical, chart, labs and discussed the procedure including the risks, benefits and alternatives for the proposed anesthesia with the patient or authorized representative who has indicated his/her understanding and acceptance.   Dental advisory given  Plan Discussed with:   Anesthesia Plan Comments: (Bier block plus MAC)        Anesthesia Quick Evaluation

## 2017-09-22 NOTE — Discharge Instructions (Signed)
Postoperative instructions:  Weightbearing instructions: no heavy lifting  Dressing instructions: Keep your dressing and/or splint clean and dry at all times.  It will be removed at your first post-operative appointment.  Your stitches and/or staples will be removed at this visit.  Incision instructions:  Do not soak your incision for 3 weeks after surgery.  If the incision gets wet, pat dry and do not scrub the incision.  Pain control:  You have been given a prescription to be taken as directed for post-operative pain control.  In addition, elevate the operative extremity above the heart at all times to prevent swelling and throbbing pain.  Take over-the-counter Colace, 100mg  by mouth twice a day while taking narcotic pain medications to help prevent constipation.  Follow up appointments: 1) 12-14 days for suture removal and wound check. 2) Dr. Erlinda Hong as scheduled.   -------------------------------------------------------------------------------------------------------------  After Surgery Pain Control:  After your surgery, post-surgical discomfort or pain is likely. This discomfort can last several days to a few weeks. At certain times of the day your discomfort may be more intense.  Did you receive a nerve block?  A nerve block can provide pain relief for one hour to two days after your surgery. As long as the nerve block is working, you will experience little or no sensation in the area the surgeon operated on.  As the nerve block wears off, you will begin to experience pain or discomfort. It is very important that you begin taking your prescribed pain medication before the nerve block fully wears off. Treating your pain at the first sign of the block wearing off will ensure your pain is better controlled and more tolerable when full-sensation returns. Do not wait until the pain is intolerable, as the medicine will be less effective. It is better to treat pain in advance than to try and catch  up.  General Anesthesia:  If you did not receive a nerve block during your surgery, you will need to start taking your pain medication shortly after your surgery and should continue to do so as prescribed by your surgeon.  Pain Medication:  Most commonly we prescribe Vicodin and Percocet for post-operative pain. Both of these medications contain a combination of acetaminophen (Tylenol) and a narcotic to help control pain.   It takes between 30 and 45 minutes before pain medication starts to work. It is important to take your medication before your pain level gets too intense.   Nausea is a common side effect of many pain medications. You will want to eat something before taking your pain medicine to help prevent nausea.   If you are taking a prescription pain medication that contains acetaminophen, we recommend that you do not take additional over the counter acetaminophen (Tylenol).  Other pain relieving options:   Using a cold pack to ice the affected area a few times a day (15 to 20 minutes at a time) can help to relieve pain, reduce swelling and bruising.   Elevation of the affected area can also help to reduce pain and swelling.      Post Anesthesia Home Care Instructions  Activity: Get plenty of rest for the remainder of the day. A responsible individual must stay with you for 24 hours following the procedure.  For the next 24 hours, DO NOT: -Drive a car -Paediatric nurse -Drink alcoholic beverages -Take any medication unless instructed by your physician -Make any legal decisions or sign important papers.  Meals: Start with liquid foods such as  gelatin or soup. Progress to regular foods as tolerated. Avoid greasy, spicy, heavy foods. If nausea and/or vomiting occur, drink only clear liquids until the nausea and/or vomiting subsides. Call your physician if vomiting continues.  Special Instructions/Symptoms: Your throat may feel dry or sore from the anesthesia or the  breathing tube placed in your throat during surgery. If this causes discomfort, gargle with warm salt water. The discomfort should disappear within 24 hours.  If you had a scopolamine patch placed behind your ear for the management of post- operative nausea and/or vomiting:  1. The medication in the patch is effective for 72 hours, after which it should be removed.  Wrap patch in a tissue and discard in the trash. Wash hands thoroughly with soap and water. 2. You may remove the patch earlier than 72 hours if you experience unpleasant side effects which may include dry mouth, dizziness or visual disturbances. 3. Avoid touching the patch. Wash your hands with soap and water after contact with the patch.     Call your surgeon if you experience:   1.  Fever over 101.0. 2.  Inability to urinate. 3.  Nausea and/or vomiting. 4.  Extreme swelling or bruising at the surgical site. 5.  Continued bleeding from the incision. 6.  Increased pain, redness or drainage from the incision. 7.  Problems related to your pain medication. 8.  Any problems and/or concerns

## 2017-09-22 NOTE — H&P (Signed)
PREOPERATIVE H&P  Chief Complaint: right carpal tunnel syndrome  HPI: Douglas Lindsey is a 72 y.o. male who presents for surgical treatment of right carpal tunnel syndrome.  He denies any changes in medical history.  Past Medical History:  Diagnosis Date  . Chronic lower back pain   . GERD (gastroesophageal reflux disease)   . Herpes simplex   . Hyperlipidemia   . Hypertension   . Sciatica    Past Surgical History:  Procedure Laterality Date  . LAPAROSCOPIC CHOLECYSTECTOMY    . MASTECTOMY    . SHOULDER SURGERY Bilateral   . WRIST ARTHRODESIS Right    Social History   Socioeconomic History  . Marital status: Married    Spouse name: Not on file  . Number of children: Not on file  . Years of education: Not on file  . Highest education level: Not on file  Occupational History  . Occupation: Full time - Administrator, sports: RETIRED  Social Needs  . Financial resource strain: Not on file  . Food insecurity:    Worry: Not on file    Inability: Not on file  . Transportation needs:    Medical: Not on file    Non-medical: Not on file  Tobacco Use  . Smoking status: Former Smoker    Packs/day: 1.00    Years: 10.00    Pack years: 10.00    Types: Cigarettes    Last attempt to quit: 04/05/1974    Years since quitting: 43.4  . Smokeless tobacco: Never Used  . Tobacco comment: quit 40 years ago  Substance and Sexual Activity  . Alcohol use: Yes    Alcohol/week: 0.0 standard drinks    Comment: Socially  . Drug use: Never  . Sexual activity: Not on file  Lifestyle  . Physical activity:    Days per week: Not on file    Minutes per session: Not on file  . Stress: Not on file  Relationships  . Social connections:    Talks on phone: Not on file    Gets together: Not on file    Attends religious service: Not on file    Active member of club or organization: Not on file    Attends meetings of clubs or organizations: Not on file    Relationship status: Not  on file  Other Topics Concern  . Not on file  Social History Narrative  . Not on file   Family History  Problem Relation Age of Onset  . Lung cancer Mother   . Stroke Father    Allergies  Allergen Reactions  . Codeine   . Cyclobenzaprine Itching   Prior to Admission medications   Medication Sig Start Date End Date Taking? Authorizing Provider  amLODipine (NORVASC) 5 MG tablet Take 5 mg by mouth daily.     Yes [provider]  aspirin 81 MG tablet Take 81 mg by mouth daily.     Yes [provider]  atorvastatin (LIPITOR) 20 MG tablet Take 20 mg by mouth daily.     Yes [provider]  Cholecalciferol (VITAMIN D3) 1000 UNITS CAPS Take 1 capsule by mouth daily.     Yes [provider]  clonazePAM (KLONOPIN) 1 MG tablet Take 0.5 mg by mouth as needed.     Yes [provider]  Cyanocobalamin (VITAMIN B 12) 250 MCG LOZG Take by mouth.   Yes [provider]  Flaxseed, Linseed, (FLAX SEED OIL) 1000  MG CAPS Take 1 capsule by mouth daily.     Yes [provider]  gabapentin (NEURONTIN) 100 MG capsule Take 1-2 capsules (100-200 mg total) by mouth 3 (three) times daily. 08/31/17  Yes Aundra Dubin, PA-C  lisinopril (PRINIVIL,ZESTRIL) 10 MG tablet Take 10 mg by mouth daily.   Yes [provider]  metoprolol tartrate (LOPRESSOR) 50 MG tablet Take 50 mg by mouth 2 (two) times daily.   Yes [provider]  omeprazole (PRILOSEC) 20 MG capsule Take 20 mg by mouth daily.     Yes [provider]  Psyllium (METAMUCIL) 48.57 % POWD Take by mouth daily.     Yes [provider]  folic acid (FOLVITE) 1 MG tablet Take 1 mg by mouth daily.      [provider]  LYRICA 50 MG capsule Take 50 mg by mouth 2 (two) times daily. 08/11/17   [provider]  nystatin (MYCOSTATIN) 100000 UNIT/ML suspension  05/05/17   [provider]     Positive ROS: All other systems have been reviewed and  were otherwise negative with the exception of those mentioned in the HPI and as above.  Physical Exam: General: Alert, no acute distress Cardiovascular: No pedal edema Respiratory: No cyanosis, no use of accessory musculature GI: abdomen soft Skin: No lesions in the area of chief complaint Neurologic: Sensation intact distally Psychiatric: Patient is competent for consent with normal mood and affect Lymphatic: no lymphedema  MUSCULOSKELETAL: exam stable  Assessment: right carpal tunnel syndrome  Plan: Plan for Procedure(s): RIGHT CARPAL TUNNEL RELEASE  The risks benefits and alternatives were discussed with the patient including but not limited to the risks of nonoperative treatment, versus surgical intervention including infection, bleeding, nerve injury,  blood clots, cardiopulmonary complications, morbidity, mortality, among others, and they were willing to proceed.   Eduard Roux, MD   09/22/2017 7:26 AM

## 2017-09-22 NOTE — Anesthesia Procedure Notes (Addendum)
Anesthesia Regional Block: Bier block (IV Regional)   Pre-Anesthetic Checklist: ,, timeout performed, Correct Patient, Correct Site, Correct Laterality, Correct Procedure, Correct Position, site marked, surgical consent, pre-op evaluation,  At surgeon's request and post-op pain management  Laterality: Right  Prep: chloraprep        Procedures:,,,,,, Esmarch exsanguination,,  Narrative:  Start time: 09/22/2017 8:50 AM End time: 09/22/2017 8:52 AM

## 2017-09-22 NOTE — Anesthesia Postprocedure Evaluation (Signed)
Anesthesia Post Note  Patient: Douglas Lindsey  Procedure(s) Performed: RIGHT CARPAL TUNNEL RELEASE (Right Wrist)     Patient location during evaluation: PACU Anesthesia Type: MAC and Bier Block Level of consciousness: awake and alert, oriented and awake Pain management: pain level controlled Vital Signs Assessment: post-procedure vital signs reviewed and stable Respiratory status: spontaneous breathing, nonlabored ventilation and respiratory function stable Cardiovascular status: stable and blood pressure returned to baseline Postop Assessment: no apparent nausea or vomiting Anesthetic complications: no    Last Vitals:  Vitals:   09/22/17 0930 09/22/17 0951  BP: (!) 150/84 131/79  Pulse: 75 69  Resp: 16 16  Temp:  36.5 C  SpO2: 95% 97%    Last Pain:  Vitals:   09/22/17 0951  TempSrc:   PainSc: 0-No pain                 Catalina Gravel

## 2017-09-23 ENCOUNTER — Telehealth (INDEPENDENT_AMBULATORY_CARE_PROVIDER_SITE_OTHER): Payer: Self-pay | Admitting: Orthopaedic Surgery

## 2017-09-23 ENCOUNTER — Other Ambulatory Visit (INDEPENDENT_AMBULATORY_CARE_PROVIDER_SITE_OTHER): Payer: Self-pay

## 2017-09-23 ENCOUNTER — Encounter (HOSPITAL_BASED_OUTPATIENT_CLINIC_OR_DEPARTMENT_OTHER): Payer: Self-pay | Admitting: Orthopaedic Surgery

## 2017-09-23 MED ORDER — TRAMADOL HCL 50 MG PO TABS: ORAL_TABLET | ORAL | 0 refills | Status: AC

## 2017-09-23 NOTE — Telephone Encounter (Signed)
Tramadol 50 mg.  1 tab tid prn #30

## 2017-09-23 NOTE — Telephone Encounter (Signed)
Called Rx into pharm

## 2017-09-23 NOTE — Telephone Encounter (Signed)
Patient called advised he took 1 tab around 1:30 and took another at 7:30pm of the Hydrocodone and started itching real bad. Patient said he stopped taking the medication. Patient said he also experience shortness of breath yesterday afternoon. Patient said his breathing seem fine now. Patient asked if he can get something else prescribed for him for pain. The number to contact patient is [phone]

## 2017-09-23 NOTE — Telephone Encounter (Signed)
Patient aware.

## 2017-09-23 NOTE — Telephone Encounter (Signed)
Please advise 

## 2017-10-06 ENCOUNTER — Encounter (INDEPENDENT_AMBULATORY_CARE_PROVIDER_SITE_OTHER): Payer: Self-pay | Admitting: Orthopaedic Surgery

## 2017-10-06 ENCOUNTER — Ambulatory Visit (INDEPENDENT_AMBULATORY_CARE_PROVIDER_SITE_OTHER): Payer: Medicare Other | Admitting: Physician Assistant

## 2017-10-06 DIAGNOSIS — Z9889 Other specified postprocedural states: Secondary | ICD-10-CM | POA: Insufficient documentation

## 2017-10-06 NOTE — Progress Notes (Signed)
   Post-Op Visit Note   Patient: Douglas Lindsey           Date of Birth: 10-30-1945           MRN: [id number] Visit Date: 10/06/2017 PCP: System, Pcp Not In   Assessment & Plan:  Chief Complaint: No chief complaint on file.  Visit Diagnoses:  1. S/P carpal tunnel release     Plan: Patient is a pleasant 72 year old gentleman who presents to our clinic today 14 days status post right carpal tunnel release, date of surgery 09/22/2017.  He still exhibits slight numbness in his fingers in the median nerve distribution, but overall notes improvement.  Minimal pain.  No fevers, chills or any other systemic symptoms.  Examination of his right hand reveals a well-healing surgical incision with nylon sutures in place.  No evidence of cellulitis or infection.  Today, nylon sutures were removed.  He will wear his removable splint for comfort over the next 2 weeks.  No heavy lifting or soaking his incision in dirty water for 2 weeks.  Follow-up with [place] in 2 weeks time for recheck.  Call with concerns or questions in the meantime.  Follow-Up Instructions: Return in about 2 weeks (around 10/20/2017).   Orders:  No orders of the defined types were placed in this encounter.  No orders of the defined types were placed in this encounter.   Imaging: No results found.  PMFS History: Patient Active Problem List   Diagnosis Date Noted  . S/P carpal tunnel release 10/06/2017  . Chronic pain of left knee 08/31/2017  . Carpal tunnel syndrome on right 08/31/2017  . Degenerative tear of left medial meniscus 05/07/2017  . Derangement of anterior horn of lateral meniscus of left knee 05/07/2017  . Impingement syndrome of right shoulder 05/07/2017  . HYPERLIPIDEMIA 12/24/2009  . HYPERTENSION 12/24/2009  . PALPITATIONS 12/24/2009   Past Medical History:  Diagnosis Date  . Chronic lower back pain   . GERD (gastroesophageal reflux disease)   . Herpes simplex   . Hyperlipidemia   . Hypertension   .  Sciatica     Family History  Problem Relation Age of Onset  . Lung cancer Mother   . Stroke Father     Past Surgical History:  Procedure Laterality Date  . CARPAL TUNNEL RELEASE Right 09/22/2017   Procedure: RIGHT CARPAL TUNNEL RELEASE;  Surgeon: Leandrew Koyanagi, MD;  Location: [facility];  Service: Orthopedics;  Laterality: Right;  . LAPAROSCOPIC CHOLECYSTECTOMY    . MASTECTOMY    . SHOULDER SURGERY Bilateral   . WRIST ARTHRODESIS Right    Social History   Occupational History  . Occupation: Full time - Administrator, sports: RETIRED  Tobacco Use  . Smoking status: Former Smoker    Packs/day: 1.00    Years: 10.00    Pack years: 10.00    Types: Cigarettes    Last attempt to quit: 04/05/1974    Years since quitting: 43.5  . Smokeless tobacco: Never Used  . Tobacco comment: quit 40 years ago  Substance and Sexual Activity  . Alcohol use: Yes    Alcohol/week: 0.0 standard drinks    Comment: Socially  . Drug use: Never  . Sexual activity: Not on file

## 2017-10-21 ENCOUNTER — Encounter (INDEPENDENT_AMBULATORY_CARE_PROVIDER_SITE_OTHER): Payer: Self-pay | Admitting: Orthopaedic Surgery

## 2017-10-21 ENCOUNTER — Ambulatory Visit (INDEPENDENT_AMBULATORY_CARE_PROVIDER_SITE_OTHER): Payer: Medicare Other | Admitting: Physician Assistant

## 2017-10-21 DIAGNOSIS — Z9889 Other specified postprocedural states: Secondary | ICD-10-CM

## 2017-10-21 NOTE — Progress Notes (Signed)
   Post-Op Visit Note   Patient: Douglas Lindsey           Date of Birth: [birth year]           MRN: [id number] Visit Date: 10/21/2017 PCP: System, Pcp Not In   Assessment & Plan:  Chief Complaint:  Chief Complaint  Patient presents with  . Right Hand - Pain   Visit Diagnoses:  1. S/P carpal tunnel release     Plan: Patient is a pleasant 72 year old gentleman who presents to our clinic today 4 weeks status post right carpal tunnel release, date of surgery 09/14/2017.  He has been doing excellent.  Minimal to no pain.  Still notes a little tingling to the median nerve distribution but this has improved.  He has been regaining strength.  Overall doing great.  Examination of his right wrist reveals well-healed surgical incision without evidence of cellulitis or infection.  At this point, he will advance with activity as tolerated.  He will follow-up with [place] as needed.  Call with concerns or questions.  Follow-Up Instructions: Return if symptoms worsen or fail to improve.   Orders:  No orders of the defined types were placed in this encounter.  No orders of the defined types were placed in this encounter.   Imaging: No new imaging  PMFS History: Patient Active Problem List   Diagnosis Date Noted  . S/P carpal tunnel release 10/06/2017  . Chronic pain of left knee 08/31/2017  . Carpal tunnel syndrome on right 08/31/2017  . Degenerative tear of left medial meniscus 05/07/2017  . Derangement of anterior horn of lateral meniscus of left knee 05/07/2017  . Impingement syndrome of right shoulder 05/07/2017  . HYPERLIPIDEMIA 12/24/2009  . HYPERTENSION 12/24/2009  . PALPITATIONS 12/24/2009   Past Medical History:  Diagnosis Date  . Chronic lower back pain   . GERD (gastroesophageal reflux disease)   . Herpes simplex   . Hyperlipidemia   . Hypertension   . Sciatica     Family History  Problem Relation Age of Onset  . Lung cancer Mother   . Stroke Father     Past Surgical  History:  Procedure Laterality Date  . CARPAL TUNNEL RELEASE Right 09/22/2017   Procedure: RIGHT CARPAL TUNNEL RELEASE;  Surgeon: Leandrew Koyanagi, MD;  Location: [facility];  Service: Orthopedics;  Laterality: Right;  . LAPAROSCOPIC CHOLECYSTECTOMY    . MASTECTOMY    . SHOULDER SURGERY Bilateral   . WRIST ARTHRODESIS Right    Social History   Occupational History  . Occupation: Full time - Administrator, sports: RETIRED  Tobacco Use  . Smoking status: Former Smoker    Packs/day: 1.00    Years: 10.00    Pack years: 10.00    Types: Cigarettes    Last attempt to quit: 04/05/1974    Years since quitting: 43.5  . Smokeless tobacco: Never Used  . Tobacco comment: quit 40 years ago  Substance and Sexual Activity  . Alcohol use: Yes    Alcohol/week: 0.0 standard drinks    Comment: Socially  . Drug use: Never  . Sexual activity: Not on file

## 2018-03-15 ENCOUNTER — Ambulatory Visit (INDEPENDENT_AMBULATORY_CARE_PROVIDER_SITE_OTHER): Payer: Medicare Other | Admitting: Orthopaedic Surgery

## 2018-03-15 ENCOUNTER — Encounter (INDEPENDENT_AMBULATORY_CARE_PROVIDER_SITE_OTHER): Payer: Self-pay | Admitting: Orthopaedic Surgery

## 2018-03-15 VITALS — Ht 68.0 in | Wt 160.0 lb

## 2018-03-15 DIAGNOSIS — G5621 Lesion of ulnar nerve, right upper limb: Secondary | ICD-10-CM | POA: Diagnosis not present

## 2018-03-15 MED ORDER — PREDNISONE 10 MG (21) PO TBPK: ORAL_TABLET | ORAL | 0 refills | Status: AC

## 2018-03-15 NOTE — Progress Notes (Signed)
Office Visit Note   Patient: Douglas Lindsey           Date of Birth: [birth year]           MRN: [id number] Visit Date: 03/15/2018              Requested by: No referring provider defined for this encounter. PCP: System, Pcp Not In   Assessment & Plan: Visit Diagnoses:  1. Cubital tunnel syndrome on right     Plan: Impression is right cubital tunnel syndrome versus ulnar nerve neuritis.  We will obtain nerve conduction studies to assess for this.  In the meantime we will place him on prednisone Dosepak.  We will see him back to review the nerve conduction studies.  Follow-Up Instructions: Return if symptoms worsen or fail to improve.   Orders:  No orders of the defined types were placed in this encounter.  Meds ordered this encounter  Medications  . predniSONE (STERAPRED UNI-PAK 21 TAB) 10 MG (21) TBPK tablet    Sig: Take as directed    Dispense:  21 tablet    Refill:  0      Procedures: No procedures performed   Clinical Data: No additional findings.   Subjective: Chief Complaint  Patient presents with  . Right Hand - Pain    Douglas Lindsey comes in today with 1 week of right small finger numbness and elbow pain.  He has mild neck pain also but he feels that this is not related.  He does take gabapentin with relief but this makes him drowsy.  He is doing well status post right carpal tunnel release 6 months ago.  He denies any injuries.   Review of Systems  Constitutional: Negative.   All other systems reviewed and are negative.    Objective: Vital Signs: Ht 5\' 8"  (1.727 m)   Wt 160 lb (72.6 kg)   BMI 24.33 kg/m   Physical Exam Vitals signs and nursing note reviewed.  Constitutional:      Appearance: He is well-developed.  Pulmonary:     Effort: Pulmonary effort is normal.  Abdominal:     Palpations: Abdomen is soft.  Skin:    General: Skin is warm.  Neurological:     Mental Status: He is alert and oriented to person, place, and time.  Psychiatric:         Behavior: Behavior normal.        Thought Content: Thought content normal.        Judgment: Judgment normal.     Ortho Exam Right hand exam shows no significant muscle atrophy.  Negative Wartenberg sign.  Ulnar nerve is stable in the cubital tunnel.  Negative Tinel's.  Mild pain with compression of the ulnar nerve. Specialty Comments:  No specialty comments available.  Imaging: No results found.   PMFS History: Patient Active Problem List   Diagnosis Date Noted  . S/P carpal tunnel release 10/06/2017  . Chronic pain of left knee 08/31/2017  . Carpal tunnel syndrome on right 08/31/2017  . Degenerative tear of left medial meniscus 05/07/2017  . Derangement of anterior horn of lateral meniscus of left knee 05/07/2017  . Impingement syndrome of right shoulder 05/07/2017  . HYPERLIPIDEMIA 12/24/2009  . HYPERTENSION 12/24/2009  . PALPITATIONS 12/24/2009   Past Medical History:  Diagnosis Date  . Chronic lower back pain   . GERD (gastroesophageal reflux disease)   . Herpes simplex   . Hyperlipidemia   . Hypertension   . Sciatica  Family History  Problem Relation Age of Onset  . Lung cancer Mother   . Stroke Father     Past Surgical History:  Procedure Laterality Date  . CARPAL TUNNEL RELEASE Right 09/22/2017   Procedure: RIGHT CARPAL TUNNEL RELEASE;  Surgeon: Leandrew Koyanagi, MD;  Location: [facility];  Service: Orthopedics;  Laterality: Right;  . LAPAROSCOPIC CHOLECYSTECTOMY    . MASTECTOMY    . SHOULDER SURGERY Bilateral   . WRIST ARTHRODESIS Right    Social History   Occupational History  . Occupation: Full time - Administrator, sports: RETIRED  Tobacco Use  . Smoking status: Former Smoker    Packs/day: 1.00    Years: 10.00    Pack years: 10.00    Types: Cigarettes    Last attempt to quit: 04/05/1974    Years since quitting: 43.9  . Smokeless tobacco: Never Used  . Tobacco comment: quit 40 years ago  Substance and Sexual  Activity  . Alcohol use: Yes    Alcohol/week: 0.0 standard drinks    Comment: Socially  . Drug use: Never  . Sexual activity: Not on file

## 2018-03-22 ENCOUNTER — Telehealth (INDEPENDENT_AMBULATORY_CARE_PROVIDER_SITE_OTHER): Payer: Self-pay | Admitting: Orthopaedic Surgery

## 2018-03-23 ENCOUNTER — Other Ambulatory Visit (INDEPENDENT_AMBULATORY_CARE_PROVIDER_SITE_OTHER): Payer: Self-pay

## 2018-03-23 DIAGNOSIS — G5621 Lesion of ulnar nerve, right upper limb: Secondary | ICD-10-CM

## 2018-03-23 NOTE — Telephone Encounter (Signed)
Order Made.  

## 2018-04-12 ENCOUNTER — Encounter (INDEPENDENT_AMBULATORY_CARE_PROVIDER_SITE_OTHER): Payer: Self-pay | Admitting: Physical Medicine and Rehabilitation

## 2018-04-12 ENCOUNTER — Ambulatory Visit (INDEPENDENT_AMBULATORY_CARE_PROVIDER_SITE_OTHER): Payer: Medicare Other | Admitting: Physical Medicine and Rehabilitation

## 2018-04-12 ENCOUNTER — Other Ambulatory Visit: Payer: Self-pay

## 2018-04-12 DIAGNOSIS — R202 Paresthesia of skin: Secondary | ICD-10-CM

## 2018-04-12 NOTE — Progress Notes (Signed)
Douglas Lindsey - 73 y.o. male MRN [id number]  Date of birth: 1945-05-14  Office Visit Note: Visit Date: 04/12/2018 PCP: System, Pcp Not In Referred by: Leandrew Koyanagi, MD  Subjective: Chief Complaint  Patient presents with  . Right Hand - Pain, Numbness   HPI: Douglas Lindsey is a 73 y.o. male who comes in today At the request of Dr. Eduard Roux for repeat electrodiagnostic study of the right upper limb.  Patient's had several weeks of pain in the elbow at times and numbness and tingling in the fifth digit.  He is right-hand dominant.  He is 6 months status post right carpal tunnel release.  He has had prior electrodiagnostic study in our office and as reviewed below.  That prior study did not show any issues with the ulnar nerve.  He does note some neck and shoulder pain but does not feel like there is radicular complaints down the arm.  He has had neck problems in the past.  He does have some pain in the palmar aspect of the hand on the ulnar side with pressure.  He denies any left-sided complaints.  ROS Otherwise per HPI.  Assessment & Plan: Visit Diagnoses:  1. Paresthesia of skin     Plan: Impression: The above electrodiagnostic study is ABNORMAL and reveals evidence of a severe right median nerve entrapment at the wrist (carpal tunnel syndrome) affecting sensory and motor components.  This however does represent improvement in electrophysiologic  values from prior study.    There is no significant electrodiagnostic evidence of any other focal nerve entrapment, brachial plexopathy or cervical radiculopathy. **There is a drop in amplitude across the elbow of the ulnar nerve but there is no slowing.  This may represent some focal problem given osteoarthritic changes of the elbow or some other issue.  It does not really meet diagnostic qualifications to say there is an ulnar nerve injury.  The sensory nerve is intact to the fifth digit.  This could also be technical artifact.   Recommendations: 1.  Follow-up with referring physician. 2.  Continue current management of symptoms.   Meds & Orders: No orders of the defined types were placed in this encounter.   Orders Placed This Encounter  Procedures  . NCV with EMG (electromyography)    Follow-up: Return for  Eduard Roux, M.D..   Procedures: No procedures performed  EMG & NCV Findings: Evaluation of the right median motor nerve showed prolonged distal onset latency (4.5 ms), reduced amplitude (2.0 mV), and decreased conduction velocity (Elbow-Wrist, 43 m/s).  The right median (across palm) sensory nerve showed no response (Palm), prolonged distal peak latency (4.0 ms), and reduced amplitude (4.4 V).  All remaining nerves (as indicated in the following tables) were within normal limits.    All examined muscles (as indicated in the following table) showed no evidence of electrical instability.    Impression: The above electrodiagnostic study is ABNORMAL and reveals evidence of a severe right median nerve entrapment at the wrist (carpal tunnel syndrome) affecting sensory and motor components.  This however does represent improvement in electrophysiologic  values from prior study.    There is no significant electrodiagnostic evidence of any other focal nerve entrapment, brachial plexopathy or cervical radiculopathy. **There is a drop in amplitude across the elbow of the ulnar nerve but there is no slowing.  This may represent some focal problem given osteoarthritic changes of the elbow or some other issue.  It does not really meet diagnostic  qualifications to say there is an ulnar nerve injury.  The sensory nerve is intact to the fifth digit.  This could also be technical artifact.  Recommendations: 1.  Follow-up with referring physician. 2.  Continue current management of symptoms.  ___________________________ [name] FAAPMR Board Certified, American Board of Physical Medicine and Rehabilitation    Nerve  Conduction Studies Anti Sensory Summary Table   Stim Site NR Peak (ms) Norm Peak (ms) P-T Amp (V) Norm P-T Amp Site1 Site2 Delta-P (ms) Dist (cm) Vel (m/s) Norm Vel (m/s)  Right Median Acr Palm Anti Sensory (2nd Digit)  31C  Wrist    *4.0 <3.6 *4.4 >10 Wrist Palm  0.0    Palm *NR  <2.0          Right Radial Anti Sensory (Base 1st Digit)  31.4C  Wrist    2.4 <3.1 23.7  Wrist Base 1st Digit 2.4 0.0    Right Ulnar Anti Sensory (5th Digit)  31.5C  Wrist    3.4 <3.7 21.0 >15.0 Wrist 5th Digit 3.4 14.0 41 >38   Motor Summary Table   Stim Site NR Onset (ms) Norm Onset (ms) O-P Amp (mV) Norm O-P Amp Site1 Site2 Delta-0 (ms) Dist (cm) Vel (m/s) Norm Vel (m/s)  Right Median Motor (Abd Poll Brev)  31.8C  Wrist    *4.5 <4.2 *2.0 >5 Elbow Wrist 4.9 21.0 *43 >50  Elbow    9.4  1.9         Right Ulnar Motor (Abd Dig Min)  31.6C  Wrist    3.3 <4.2 6.6 >3 B Elbow Wrist 3.8 21.0 55 >53  B Elbow    7.1  2.6  A Elbow B Elbow 1.9 10.0 53 >53  A Elbow    9.0  1.1          EMG   Side Muscle Nerve Root Ins Act Fibs Psw Amp Dur Poly Recrt Int Fraser Din Comment  Right Abd Poll Brev Median C8-T1 Nml Nml Nml Nml Nml 0 Nml Nml   Right 1stDorInt Ulnar C8-T1 Nml Nml Nml Nml Nml 0 Nml Nml   Right PronatorTeres Median C6-7 Nml Nml Nml Nml Nml 0 Nml Nml   Right Biceps Musculocut C5-6 Nml Nml Nml Nml Nml 0 Nml Nml   Right Deltoid Axillary C5-6 Nml Nml Nml Nml Nml 0 Nml Nml     Nerve Conduction Studies Anti Sensory Left/Right Comparison   Stim Site L Lat (ms) R Lat (ms) L-R Lat (ms) L Amp (V) R Amp (V) L-R Amp (%) Site1 Site2 L Vel (m/s) R Vel (m/s) L-R Vel (m/s)  Median Acr Palm Anti Sensory (2nd Digit)  31C  Wrist  *4.0   *4.4  Wrist Palm     Palm             Radial Anti Sensory (Base 1st Digit)  31.4C  Wrist  2.4   23.7  Wrist Base 1st Digit     Ulnar Anti Sensory (5th Digit)  31.5C  Wrist  3.4   21.0  Wrist 5th Digit  41    Motor Left/Right Comparison   Stim Site L Lat (ms) R Lat (ms) L-R Lat  (ms) L Amp (mV) R Amp (mV) L-R Amp (%) Site1 Site2 L Vel (m/s) R Vel (m/s) L-R Vel (m/s)  Median Motor (Abd Poll Brev)  31.8C  Wrist  *4.5   *2.0  Elbow Wrist  *43   Elbow  9.4   1.9  Ulnar Motor (Abd Dig Min)  31.6C  Wrist  3.3   6.6  B Elbow Wrist  55   B Elbow  7.1   2.6  A Elbow B Elbow  53   A Elbow  9.0   1.1           Waveforms:            Clinical History: 08/27/2017 EMG/NCS Impression: The above electrodiagnostic study is ABNORMAL and reveals evidence of a severe right median nerve entrapment at the wrist (carpal tunnel syndrome) affecting sensory and motor components. **Prognostically with fairly normal needle EMG and lack of significant atrophy the patient should make pretty good recovery with proper decompression.  The patient could have some residual decreased sensation to touch given the axonal damage.  Lastly, there may be some level of conduction block which also would bode well for recovery when sufficiently decompressed.   There is no significant electrodiagnostic evidence of any other focal nerve entrapment, brachial plexopathy or cervical radiculopathy.   Recommendations: 1.  Follow-up with referring physician. 2.  Continue current management of symptoms. 3.  Suggest surgical evaluation.   He reports that he quit smoking about 44 years ago. His smoking use included cigarettes. He has a 10.00 pack-year smoking history. He has never used smokeless tobacco. No results for input(s): HGBA1C, LABURIC in the last 8760 hours.  Objective:  VS:  HT:    WT:   BMI:     BP:   HR: bpm  TEMP: ( )  RESP:  Physical Exam Musculoskeletal:        General: No tenderness.     Comments: Inspection reveals well-healed surgical scar on the right wrist but no atrophy of the bilateral APB or FDI or hand intrinsics. There is no swelling, color changes, allodynia or dystrophic changes. There is 5 out of 5 strength in the bilateral wrist extension, finger abduction and long  finger flexion. There is intact sensation to light touch in all dermatomal and peripheral nerve distributions. There is a negative Froment's test bilaterally. There is a negative Tinel's test at the bilateral wrist and elbow. There is a negative Hoffmann's test bilaterally.  Skin:    General: Skin is warm and dry.     Findings: No erythema or rash.  Neurological:     Mental Status: He is alert and oriented to person, place, and time.     Motor: No abnormal muscle tone.     Coordination: Coordination normal.     Ortho Exam Imaging: No results found.  Past Medical/Family/Surgical/Social History: Medications & Allergies reviewed per EMR, new medications updated. Patient Active Problem List   Diagnosis Date Noted  . S/P carpal tunnel release 10/06/2017  . Chronic pain of left knee 08/31/2017  . Carpal tunnel syndrome on right 08/31/2017  . Degenerative tear of left medial meniscus 05/07/2017  . Derangement of anterior horn of lateral meniscus of left knee 05/07/2017  . Impingement syndrome of right shoulder 05/07/2017  . HYPERLIPIDEMIA 12/24/2009  . HYPERTENSION 12/24/2009  . PALPITATIONS 12/24/2009   Past Medical History:  Diagnosis Date  . Chronic lower back pain   . GERD (gastroesophageal reflux disease)   . Herpes simplex   . Hyperlipidemia   . Hypertension   . Sciatica    Family History  Problem Relation Age of Onset  . Lung cancer Mother   . Stroke Father    Past Surgical History:  Procedure Laterality Date  . CARPAL TUNNEL RELEASE Right 09/22/2017  Procedure: RIGHT CARPAL TUNNEL RELEASE;  Surgeon: Leandrew Koyanagi, MD;  Location: [facility];  Service: Orthopedics;  Laterality: Right;  . LAPAROSCOPIC CHOLECYSTECTOMY    . MASTECTOMY    . SHOULDER SURGERY Bilateral   . WRIST ARTHRODESIS Right    Social History   Occupational History  . Occupation: Full time - Administrator, sports: RETIRED  Tobacco Use  . Smoking status: Former Smoker     Packs/day: 1.00    Years: 10.00    Pack years: 10.00    Types: Cigarettes    Last attempt to quit: 04/05/1974    Years since quitting: 44.0  . Smokeless tobacco: Never Used  . Tobacco comment: quit 40 years ago  Substance and Sexual Activity  . Alcohol use: Yes    Alcohol/week: 0.0 standard drinks    Comment: Socially  . Drug use: Never  . Sexual activity: Not on file

## 2018-04-12 NOTE — Progress Notes (Signed)
  Numeric Pain Rating Scale and Functional Assessment Average Pain 0   In the last MONTH (on 0-10 scale) has pain interfered with the following?  1. General activity like being  able to carry out your everyday physical activities such as walking, climbing stairs, carrying groceries, or moving a chair?  Rating(4)

## 2018-04-12 NOTE — Procedures (Signed)
EMG & NCV Findings: Evaluation of the right median motor nerve showed prolonged distal onset latency (4.5 ms), reduced amplitude (2.0 mV), and decreased conduction velocity (Elbow-Wrist, 43 m/s).  The right median (across palm) sensory nerve showed no response (Palm), prolonged distal peak latency (4.0 ms), and reduced amplitude (4.4 V).  All remaining nerves (as indicated in the following tables) were within normal limits.    All examined muscles (as indicated in the following table) showed no evidence of electrical instability.    Impression: The above electrodiagnostic study is ABNORMAL and reveals evidence of a severe right median nerve entrapment at the wrist (carpal tunnel syndrome) affecting sensory and motor components.  This however does represent improvement in electrophysiologic  values from prior study.    There is no significant electrodiagnostic evidence of any other focal nerve entrapment, brachial plexopathy or cervical radiculopathy. **There is a drop in amplitude across the elbow of the ulnar nerve but there is no slowing.  This may represent some focal problem given osteoarthritic changes of the elbow or some other issue.  It does not really meet diagnostic qualifications to say there is an ulnar nerve injury.  The sensory nerve is intact to the fifth digit.  This could also be technical artifact.  Recommendations: 1.  Follow-up with referring physician. 2.  Continue current management of symptoms.  ___________________________ [name] FAAPMR Board Certified, American Board of Physical Medicine and Rehabilitation    Nerve Conduction Studies Anti Sensory Summary Table   Stim Site NR Peak (ms) Norm Peak (ms) P-T Amp (V) Norm P-T Amp Site1 Site2 Delta-P (ms) Dist (cm) Vel (m/s) Norm Vel (m/s)  Right Median Acr Palm Anti Sensory (2nd Digit)  31C  Wrist    *4.0 <3.6 *4.4 >10 Wrist Palm  0.0    Palm *NR  <2.0          Right Radial Anti Sensory (Base 1st Digit)  31.4C   Wrist    2.4 <3.1 23.7  Wrist Base 1st Digit 2.4 0.0    Right Ulnar Anti Sensory (5th Digit)  31.5C  Wrist    3.4 <3.7 21.0 >15.0 Wrist 5th Digit 3.4 14.0 41 >38   Motor Summary Table   Stim Site NR Onset (ms) Norm Onset (ms) O-P Amp (mV) Norm O-P Amp Site1 Site2 Delta-0 (ms) Dist (cm) Vel (m/s) Norm Vel (m/s)  Right Median Motor (Abd Poll Brev)  31.8C  Wrist    *4.5 <4.2 *2.0 >5 Elbow Wrist 4.9 21.0 *43 >50  Elbow    9.4  1.9         Right Ulnar Motor (Abd Dig Min)  31.6C  Wrist    3.3 <4.2 6.6 >3 B Elbow Wrist 3.8 21.0 55 >53  B Elbow    7.1  2.6  A Elbow B Elbow 1.9 10.0 53 >53  A Elbow    9.0  1.1          EMG   Side Muscle Nerve Root Ins Act Fibs Psw Amp Dur Poly Recrt Int Fraser Din Comment  Right Abd Poll Brev Median C8-T1 Nml Nml Nml Nml Nml 0 Nml Nml   Right 1stDorInt Ulnar C8-T1 Nml Nml Nml Nml Nml 0 Nml Nml   Right PronatorTeres Median C6-7 Nml Nml Nml Nml Nml 0 Nml Nml   Right Biceps Musculocut C5-6 Nml Nml Nml Nml Nml 0 Nml Nml   Right Deltoid Axillary C5-6 Nml Nml Nml Nml Nml 0 Nml Nml     Nerve  Conduction Studies Anti Sensory Left/Right Comparison   Stim Site L Lat (ms) R Lat (ms) L-R Lat (ms) L Amp (V) R Amp (V) L-R Amp (%) Site1 Site2 L Vel (m/s) R Vel (m/s) L-R Vel (m/s)  Median Acr Palm Anti Sensory (2nd Digit)  31C  Wrist  *4.0   *4.4  Wrist Palm     Palm             Radial Anti Sensory (Base 1st Digit)  31.4C  Wrist  2.4   23.7  Wrist Base 1st Digit     Ulnar Anti Sensory (5th Digit)  31.5C  Wrist  3.4   21.0  Wrist 5th Digit  41    Motor Left/Right Comparison   Stim Site L Lat (ms) R Lat (ms) L-R Lat (ms) L Amp (mV) R Amp (mV) L-R Amp (%) Site1 Site2 L Vel (m/s) R Vel (m/s) L-R Vel (m/s)  Median Motor (Abd Poll Brev)  31.8C  Wrist  *4.5   *2.0  Elbow Wrist  *43   Elbow  9.4   1.9        Ulnar Motor (Abd Dig Min)  31.6C  Wrist  3.3   6.6  B Elbow Wrist  55   B Elbow  7.1   2.6  A Elbow B Elbow  53   A Elbow  9.0   1.1           Waveforms:

## 2018-04-14 ENCOUNTER — Ambulatory Visit (INDEPENDENT_AMBULATORY_CARE_PROVIDER_SITE_OTHER): Payer: Self-pay | Admitting: Orthopaedic Surgery

## 2018-04-15 ENCOUNTER — Encounter (INDEPENDENT_AMBULATORY_CARE_PROVIDER_SITE_OTHER): Payer: Self-pay | Admitting: Orthopaedic Surgery

## 2018-04-15 ENCOUNTER — Ambulatory Visit (INDEPENDENT_AMBULATORY_CARE_PROVIDER_SITE_OTHER): Payer: Medicare Other | Admitting: Orthopaedic Surgery

## 2018-04-15 ENCOUNTER — Other Ambulatory Visit: Payer: Self-pay

## 2018-04-15 DIAGNOSIS — M545 Low back pain: Secondary | ICD-10-CM

## 2018-04-15 DIAGNOSIS — E785 Hyperlipidemia, unspecified: Secondary | ICD-10-CM

## 2018-04-15 DIAGNOSIS — Z9889 Other specified postprocedural states: Secondary | ICD-10-CM

## 2018-04-15 DIAGNOSIS — M79641 Pain in right hand: Secondary | ICD-10-CM | POA: Diagnosis not present

## 2018-04-15 DIAGNOSIS — M5412 Radiculopathy, cervical region: Secondary | ICD-10-CM | POA: Diagnosis not present

## 2018-04-15 DIAGNOSIS — I10 Essential (primary) hypertension: Secondary | ICD-10-CM

## 2018-04-15 DIAGNOSIS — G8929 Other chronic pain: Secondary | ICD-10-CM

## 2018-04-15 NOTE — Progress Notes (Signed)
Office Visit Note   Patient: Douglas Lindsey           Date of Birth: 1946-01-17           MRN: [id number] Visit Date: 04/15/2018              Requested by: No referring provider defined for this encounter. PCP: System, Pcp Not In   Assessment & Plan: Visit Diagnoses:  1. S/P carpal tunnel release   2. Cervical radiculopathy     Plan: Impression is cervical radiculopathy.  Patient has had relief from epidural steroid injections with Dr. Ernestina Lindsey in the past and requests a repeat injection.  We will see him back as needed.  Follow-Up Instructions: Return if symptoms worsen or fail to improve.   Orders:  No orders of the defined types were placed in this encounter.  No orders of the defined types were placed in this encounter.     Procedures: No procedures performed   Clinical Data: No additional findings.   Subjective: Chief Complaint  Patient presents with  . Right Hand - Pain    Douglas Lindsey comes in today for review of his nerve conduction studies.  He states that his right hand feels well and has been improving since the carpal tunnel release.  He is having more issues with radiating pain from the left side of his neck down into the left hand.  His nerve conduction study did not show any evidence of cubital tunnel syndrome.   Review of Systems  Constitutional: Negative.   All other systems reviewed and are negative.    Objective: Vital Signs: There were no vitals taken for this visit.  Physical Exam Vitals signs and nursing note reviewed.  Constitutional:      Appearance: He is well-developed.  Pulmonary:     Effort: Pulmonary effort is normal.  Abdominal:     Palpations: Abdomen is soft.  Skin:    General: Skin is warm.  Neurological:     Mental Status: He is alert and oriented to person, place, and time.  Psychiatric:        Behavior: Behavior normal.        Thought Content: Thought content normal.        Judgment: Judgment normal.     Ortho  Exam Exam is stable for the right hand. Positive Spurling sign. Specialty Comments:  No specialty comments available.  Imaging: No results found.   PMFS History: Patient Active Problem List   Diagnosis Date Noted  . S/P carpal tunnel release 10/06/2017  . Chronic pain of left knee 08/31/2017  . Carpal tunnel syndrome on right 08/31/2017  . Degenerative tear of left medial meniscus 05/07/2017  . Derangement of anterior horn of lateral meniscus of left knee 05/07/2017  . Impingement syndrome of right shoulder 05/07/2017  . HYPERLIPIDEMIA 12/24/2009  . HYPERTENSION 12/24/2009  . PALPITATIONS 12/24/2009   Past Medical History:  Diagnosis Date  . Chronic lower back pain   . GERD (gastroesophageal reflux disease)   . Herpes simplex   . Hyperlipidemia   . Hypertension   . Sciatica     Family History  Problem Relation Age of Onset  . Lung cancer Mother   . Stroke Father     Past Surgical History:  Procedure Laterality Date  . CARPAL TUNNEL RELEASE Right 09/22/2017   Procedure: RIGHT CARPAL TUNNEL RELEASE;  Surgeon: Douglas Koyanagi, MD;  Location: [facility];  Service: Orthopedics;  Laterality: Right;  .  LAPAROSCOPIC CHOLECYSTECTOMY    . MASTECTOMY    . SHOULDER SURGERY Bilateral   . WRIST ARTHRODESIS Right    Social History   Occupational History  . Occupation: Full time - Administrator, sports: RETIRED  Tobacco Use  . Smoking status: Former Smoker    Packs/day: 1.00    Years: 10.00    Pack years: 10.00    Types: Cigarettes    Last attempt to quit: 04/05/1974    Years since quitting: 44.0  . Smokeless tobacco: Never Used  . Tobacco comment: quit 40 years ago  Substance and Sexual Activity  . Alcohol use: Yes    Alcohol/week: 0.0 standard drinks    Comment: Socially  . Drug use: Never  . Sexual activity: Not on file

## 2018-04-15 NOTE — Addendum Note (Signed)
Addended by: Precious Bard on: 04/15/2018 01:14 PM   Modules accepted: Orders

## 2018-04-25 ENCOUNTER — Telehealth (INDEPENDENT_AMBULATORY_CARE_PROVIDER_SITE_OTHER): Payer: Self-pay

## 2018-04-25 NOTE — Telephone Encounter (Signed)
Called patient and asked the screening questions.  Do you have now or have you had in the past 7 days a fever and/or chills? NO  Do you have now or have you had in the past 7 days a cough? NO  Do you have now or have you had in the last 7 days nausea, vomiting or abdominal pain? NO  Have you been exposed to anyone who has tested positive for COVID-19? NO  Have you or anyone who lives with you traveled within the last month? NO 

## 2018-04-26 ENCOUNTER — Other Ambulatory Visit: Payer: Self-pay

## 2018-04-26 ENCOUNTER — Ambulatory Visit (INDEPENDENT_AMBULATORY_CARE_PROVIDER_SITE_OTHER): Payer: Medicare Other | Admitting: Orthopaedic Surgery

## 2018-04-26 ENCOUNTER — Encounter (INDEPENDENT_AMBULATORY_CARE_PROVIDER_SITE_OTHER): Payer: Self-pay | Admitting: Orthopaedic Surgery

## 2018-04-26 DIAGNOSIS — M79641 Pain in right hand: Secondary | ICD-10-CM | POA: Insufficient documentation

## 2018-04-26 MED ORDER — PREDNISONE 10 MG (21) PO TBPK: ORAL_TABLET | ORAL | 0 refills | Status: AC

## 2018-04-26 MED ORDER — DICLOFENAC SODIUM 1 % TD GEL: 2.0000 g | Freq: Four times a day (QID) | TRANSDERMAL | 2 refills | Status: AC

## 2018-04-26 NOTE — Progress Notes (Signed)
Office Visit Note   Patient: Douglas Lindsey           Date of Birth: 1945-04-10           MRN: [id number] Visit Date: 04/26/2018              Requested by: No referring provider defined for this encounter. PCP: System, Pcp Not In   Assessment & Plan: Visit Diagnoses:  1. Pain in right hand     Plan: Impression is status post right carpal tunnel syndrome with severe median nerve entrapment and cervical spine radiculopathy.  I discussed with the patient that due to his underlying median nerve entrapment severity, that this may take up to a year before he notices improvement of symptoms if he even notices complete resolution.  Also believe he may have a cervical spine component which she is not able to address with ESI for another month.  I will call in another steroid taper as well as Voltaren gel to use as needed.  Follow-up with [place] as needed.  Follow-Up Instructions: Return if symptoms worsen or fail to improve.   Orders:  No orders of the defined types were placed in this encounter.  Meds ordered this encounter  Medications  . predniSONE (STERAPRED UNI-PAK 21 TAB) 10 MG (21) TBPK tablet    Sig: Take as directed    Dispense:  21 tablet    Refill:  0  . diclofenac sodium (VOLTAREN) 1 % GEL    Sig: Apply 2 g topically 4 (four) times daily.    Dispense:  1 Tube    Refill:  2      Procedures: No procedures performed   Clinical Data: No additional findings.   Subjective: Chief Complaint  Patient presents with  . Right Hand - Follow-up, Pain    HPI patient is a 73 year old gentleman who presents our clinic today with continued right hand pain.  He is 7 months status post right carpal tunnel syndrome, date of surgery 09/22/2017.  Nerve conduction studies from August 2019 showed a severe median nerve entrapment.  He has gradually noticed slight improvement in symptoms since surgery.  Recent nerve conduction study from March 2020 still showed severe median nerve entrapment  which was slightly improved compared to previous studies.  The patient is having pain to the right index finger and numbness to the right small finger.  He has been taking over-the-counter anti-inflammatories with mild relief of symptoms.  He does have a history of cervical spine radiculopathy.  He is had epidural steroid injections in the past with good relief.  He has an upcoming ESI scheduled with Dr. Ernestina Patches for May.  Review of Systems as detailed in HPI.  All others reviewed and are negative.   Objective: Vital Signs: There were no vitals taken for this visit.  Physical Exam well-developed well-nourished gentleman in no acute distress.  Alert and oriented x3.  Ortho Exam stable exam of the right upper extremity and neck.  Specialty Comments:  No specialty comments available.  Imaging: No new imaging   PMFS History: Patient Active Problem List   Diagnosis Date Noted  . Pain in right hand 04/26/2018  . S/P carpal tunnel release 10/06/2017  . Chronic pain of left knee 08/31/2017  . Carpal tunnel syndrome on right 08/31/2017  . Degenerative tear of left medial meniscus 05/07/2017  . Derangement of anterior horn of lateral meniscus of left knee 05/07/2017  . Impingement syndrome of right shoulder 05/07/2017  .  HYPERLIPIDEMIA 12/24/2009  . HYPERTENSION 12/24/2009  . PALPITATIONS 12/24/2009   Past Medical History:  Diagnosis Date  . Chronic lower back pain   . GERD (gastroesophageal reflux disease)   . Herpes simplex   . Hyperlipidemia   . Hypertension   . Sciatica     Family History  Problem Relation Age of Onset  . Lung cancer Mother   . Stroke Father     Past Surgical History:  Procedure Laterality Date  . CARPAL TUNNEL RELEASE Right 09/22/2017   Procedure: RIGHT CARPAL TUNNEL RELEASE;  Surgeon: Leandrew Koyanagi, MD;  Location: [facility];  Service: Orthopedics;  Laterality: Right;  . LAPAROSCOPIC CHOLECYSTECTOMY    . MASTECTOMY    . SHOULDER SURGERY  Bilateral   . WRIST ARTHRODESIS Right    Social History   Occupational History  . Occupation: Full time - Administrator, sports: RETIRED  Tobacco Use  . Smoking status: Former Smoker    Packs/day: 1.00    Years: 10.00    Pack years: 10.00    Types: Cigarettes    Last attempt to quit: 04/05/1974    Years since quitting: 44.0  . Smokeless tobacco: Never Used  . Tobacco comment: quit 40 years ago  Substance and Sexual Activity  . Alcohol use: Yes    Alcohol/week: 0.0 standard drinks    Comment: Socially  . Drug use: Never  . Sexual activity: Not on file

## 2018-05-30 ENCOUNTER — Encounter (INDEPENDENT_AMBULATORY_CARE_PROVIDER_SITE_OTHER): Payer: Self-pay | Admitting: Physical Medicine and Rehabilitation

## 2018-06-13 ENCOUNTER — Ambulatory Visit: Payer: Medicare Other | Admitting: Orthopaedic Surgery

## 2018-06-13 ENCOUNTER — Encounter: Payer: Self-pay | Admitting: Orthopaedic Surgery

## 2018-06-13 ENCOUNTER — Ambulatory Visit: Payer: Medicare Other

## 2018-06-13 ENCOUNTER — Ambulatory Visit: Payer: Medicare Other | Admitting: Physical Medicine and Rehabilitation

## 2018-06-13 ENCOUNTER — Encounter: Payer: Self-pay | Admitting: Physical Medicine and Rehabilitation

## 2018-06-13 ENCOUNTER — Other Ambulatory Visit: Payer: Self-pay

## 2018-06-13 VITALS — BP 155/93 | HR 61

## 2018-06-13 DIAGNOSIS — M25561 Pain in right knee: Secondary | ICD-10-CM

## 2018-06-13 DIAGNOSIS — G8929 Other chronic pain: Secondary | ICD-10-CM | POA: Diagnosis not present

## 2018-06-13 DIAGNOSIS — M25562 Pain in left knee: Secondary | ICD-10-CM

## 2018-06-13 DIAGNOSIS — M5412 Radiculopathy, cervical region: Secondary | ICD-10-CM

## 2018-06-13 MED ORDER — BUPIVACAINE HCL 0.25 % IJ SOLN
2.0000 mL | INTRAMUSCULAR | Status: AC | PRN
  Administered 2018-06-13: 2 mL via INTRA_ARTICULAR

## 2018-06-13 MED ORDER — METHYLPREDNISOLONE ACETATE 40 MG/ML IJ SUSP
40.0000 mg | INTRAMUSCULAR | Status: AC | PRN
  Administered 2018-06-13: 40 mg via INTRA_ARTICULAR

## 2018-06-13 MED ORDER — METHYLPREDNISOLONE ACETATE 80 MG/ML IJ SUSP
80.0000 mg | Freq: Once | INTRAMUSCULAR | Status: AC
  Administered 2018-06-13: 80 mg

## 2018-06-13 MED ORDER — LIDOCAINE HCL 1 % IJ SOLN
2.0000 mL | INTRAMUSCULAR | Status: AC | PRN
  Administered 2018-06-13: 11:00:00 2 mL

## 2018-06-13 NOTE — Progress Notes (Signed)
Office Visit Note   Patient: Douglas Lindsey           Date of Birth: 1945-04-19           MRN: [id number] Visit Date: 06/13/2018              Requested by: No referring provider defined for this encounter. PCP: System, Pcp Not In   Assessment & Plan: Visit Diagnoses:  1. Chronic pain of both knees     Plan: Impression is bilateral knee osteoarthritis.  We will inject the left, more symptomatic knee today with cortisone.  If his right knee worsens, he will follow-up with [place] for a right knee cortisone injection.  Of note, he did have a C-spine epidural with Dr. Ernestina Patches today.  He mentions the possibility of going to formal physical therapy for dry needling should the injection failed to give him significant relief of symptoms.  He will call and let us know.  He will follow-up with [place] as needed.  Follow-Up Instructions: Return if symptoms worsen or fail to improve.   Orders:  Orders Placed This Encounter  Procedures  . Large Joint Inj: L knee  . XR KNEE 3 VIEW LEFT  . XR KNEE 3 VIEW RIGHT   No orders of the defined types were placed in this encounter.     Procedures: Large Joint Inj: L knee on 06/13/2018 10:50 AM Indications: pain Details: 22 G needle, anterolateral approach Medications: 2 mL bupivacaine 0.25 %; 2 mL lidocaine 1 %; 40 mg methylPREDNISolone acetate 40 MG/ML      Clinical Data: No additional findings.   Subjective: Chief Complaint  Patient presents with  . Left Knee - Pain  . Right Knee - Pain    HPI patient is a pleasant 73 year old gentleman who presents our clinic today with recurrent bilateral knee pain.  History of mild bilateral osteoarthritis with a degenerative tear lateral meniscus left knee.  He was seen in our office where his left knee was injected with cortisone in May 2019.  His right knee was injected in August 2019.  He did well with these injections until recently when his pain returned.  No new injury.  Left knee is far worse than  the right.  He has pain primarily when bending over going from a seated to standing position.  He denies any locking, catching or instability.  He has tried over-the-counter medications without relief of symptoms.  Review of Systems as detailed in HPI.  All others reviewed and are negative.   Objective: Vital Signs: There were no vitals taken for this visit.  Physical Exam well-developed and well-nourished gentleman in no acute distress.  Alert and oriented x3.  Ortho Exam examination of both knees reveals no effusion.  Range of motion 0 to 125 degrees.  Mild patellofemoral crepitus.  Mild medial and lateral joint line tenderness both sides.  Ligaments are stable.  He is neurovascularly intact distally.  Specialty Comments:  No specialty comments available.  Imaging: Xr Knee 3 View Left  Result Date: 06/13/2018 Mild to moderate joint space narrowing  Xr Knee 3 View Right  Result Date: 06/13/2018 Mild to moderate joint space narrowing    PMFS History: Patient Active Problem List   Diagnosis Date Noted  . Pain in right hand 04/26/2018  . S/P carpal tunnel release 10/06/2017  . Chronic pain of both knees 08/31/2017  . Carpal tunnel syndrome on right 08/31/2017  . Degenerative tear of left medial meniscus 05/07/2017  .  Derangement of anterior horn of lateral meniscus of left knee 05/07/2017  . Impingement syndrome of right shoulder 05/07/2017  . HYPERLIPIDEMIA 12/24/2009  . HYPERTENSION 12/24/2009  . PALPITATIONS 12/24/2009   Past Medical History:  Diagnosis Date  . Chronic lower back pain   . GERD (gastroesophageal reflux disease)   . Herpes simplex   . Hyperlipidemia   . Hypertension   . Sciatica     Family History  Problem Relation Age of Onset  . Lung cancer Mother   . Stroke Father     Past Surgical History:  Procedure Laterality Date  . CARPAL TUNNEL RELEASE Right 09/22/2017   Procedure: RIGHT CARPAL TUNNEL RELEASE;  Surgeon: Leandrew Koyanagi, MD;  Location:  [place];  Service: Orthopedics;  Laterality: Right;  . LAPAROSCOPIC CHOLECYSTECTOMY    . MASTECTOMY    . SHOULDER SURGERY Bilateral   . WRIST ARTHRODESIS Right    Social History   Occupational History  . Occupation: Full time - Administrator, sports: RETIRED  Tobacco Use  . Smoking status: Former Smoker    Packs/day: 1.00    Years: 10.00    Pack years: 10.00    Types: Cigarettes    Last attempt to quit: 04/05/1974    Years since quitting: 44.2  . Smokeless tobacco: Never Used  . Tobacco comment: quit 40 years ago  Substance and Sexual Activity  . Alcohol use: Yes    Alcohol/week: 0.0 standard drinks    Comment: Socially  . Drug use: Never  . Sexual activity: Not on file

## 2018-06-13 NOTE — Progress Notes (Signed)
 .  Numeric Pain Rating Scale and Functional Assessment Average Pain 4   In the last MONTH (on 0-10 scale) has pain interfered with the following?  1. General activity like being  able to carry out your everyday physical activities such as walking, climbing stairs, carrying groceries, or moving a chair?  Rating(5)   +Driver, -BT, -Dye Allergies.

## 2018-06-13 NOTE — Progress Notes (Signed)
Douglas Lindsey - 73 y.o. male MRN [id number]  Date of birth: Aug 26, 1945  Office Visit Note: Visit Date: 06/13/2018 PCP: System, Pcp Not In Referred by: Leandrew Koyanagi, MD  Subjective: Chief Complaint  Patient presents with  . Head - Pain  . Neck - Pain  . Left Arm - Pain  . Left Hand - Numbness   HPI:  Douglas Lindsey is a 73 y.o. male who comes in today At the request of Dr. Eduard Roux for left C7-T1 interlaminar epidural steroid injection.  Patient's had prior right C7-T1 injection with some relief of neck and shoulder and arm pain that he was pretty pleased with it.  At the time that I saw him I felt like he was also having some carpal tunnel issues and electrodiagnostic study did show severe carpal tunnel syndrome on the right and he went on to have carpal tunnel release and did well after that although he feels like the injection in the neck seem to help his symptoms just as well as the surgical release.  The nerve was severe and there can be residual symptoms despite decompression.  The patient is having left-sided neck arm and hand pain and numbness.  MRI was reviewed the last time we saw him.  This was done at the [facility] does show some foraminal narrowing but no high-grade central stenosis.  Some of his pain that he is complaining I think is myofascial pain as well as arthritic pain on the left and he gets some referral pattern into the face and head.  He gets some paresthesia-like syndrome and I think Dr. Erlinda Hong may want to look at referral to physical therapist for dry needling and potentially to a neurologist if they really felt like this was more facial pain with paresthesia.  ROS Otherwise per HPI.  Assessment & Plan: Visit Diagnoses:  1. Cervical radiculopathy     Plan: No additional findings.   Meds & Orders:  Meds ordered this encounter  Medications  . methylPREDNISolone acetate (DEPO-MEDROL) injection 80 mg    Orders Placed This Encounter  Procedures   . XR C-ARM NO REPORT  . Epidural Steroid injection    Follow-up: Return if symptoms worsen or fail to improve.   Procedures: No procedures performed  Cervical Epidural Steroid Injection - Interlaminar Approach with Fluoroscopic Guidance  Patient: Douglas Lindsey      Date of Birth: [birth year] MRN: [id number] PCP: System, Pcp Not In      Visit Date: 06/13/2018   Universal Protocol:    Date/Time: 06/13/2010:48 PM  Consent Given By: the patient  Position: PRONE  Additional Comments: Vital signs were monitored before and after the procedure. Patient was prepped and draped in the usual sterile fashion. The correct patient, procedure, and site was verified.   Injection Procedure Details:  Procedure Site One Meds Administered:  Meds ordered this encounter  Medications  . methylPREDNISolone acetate (DEPO-MEDROL) injection 80 mg     Laterality: Left  Location/Site: C7-T1  Needle size: 20 G  Needle type: Touhy  Needle Placement: Paramedian epidural space  Findings:  -Comments: Excellent flow of contrast into the epidural space.  Procedure Details: Using a paramedian approach from the side mentioned above, the region overlying the inferior lamina was localized under fluoroscopic visualization and the soft tissues overlying this structure were infiltrated with 4 ml. of 1% Lidocaine without Epinephrine. A # 20 gauge, Tuohy needle was inserted into the epidural space using a paramedian approach.  The epidural space was localized using loss of resistance along with lateral and contralateral oblique bi-planar fluoroscopic views.  After negative aspirate for air, blood, and CSF, a 2 ml. volume of Isovue-250 was injected into the epidural space and the flow of contrast was observed. Radiographs were obtained for documentation purposes.   The injectate was administered into the level noted above.  Additional Comments:  The patient tolerated the procedure well Dressing: 2 x 2  sterile gauze and Band-Aid    Post-procedure details: Patient was observed during the procedure. Post-procedure instructions were reviewed.  Patient left the clinic in stable condition.   Clinical History: 08/27/2017 EMG/NCS Impression: The above electrodiagnostic study is ABNORMAL and reveals evidence of a severe right median nerve entrapment at the wrist (carpal tunnel syndrome) affecting sensory and motor components. **Prognostically with fairly normal needle EMG and lack of significant atrophy the patient should make pretty good recovery with proper decompression. The patient could have some residual decreased sensation to touch given the axonal damage. Lastly, there may be some level of conduction block which also would bode well for recovery when sufficiently decompressed.   There is no significant electrodiagnostic evidence of any other focal nerve entrapment, brachial plexopathy or cervical radiculopathy.   Recommendations: 1. Follow-up with referring physician. 2. Continue current management of symptoms. 3. Suggest surgical evaluation. --------- Cervical MRI 2019 [facility]  interim.  I did review the MRI imaging and this shows multilevel spondylosis with degenerative disc height loss and some increased kyphosis with by foraminal narrowing at C5-6.     Objective:  VS:  HT:    WT:   BMI:     BP:(!) 155/93  HR:61bpm  TEMP: ( )  RESP:  Physical Exam  Ortho Exam Imaging: Xr C-arm No Report  Result Date: 06/13/2018 Please see Notes tab for imaging impression.  Xr Knee 3 View Left  Result Date: 06/13/2018 Mild to moderate joint space narrowing  Xr Knee 3 View Right  Result Date: 06/13/2018 Mild to moderate joint space narrowing

## 2018-06-13 NOTE — Procedures (Signed)
Cervical Epidural Steroid Injection - Interlaminar Approach with Fluoroscopic Guidance  Patient: Douglas Lindsey      Date of Birth: 10-Sep-1945 MRN: [id number] PCP: System, Pcp Not In      Visit Date: 06/13/2018   Universal Protocol:    Date/Time: 06/13/2010:48 PM  Consent Given By: the patient  Position: PRONE  Additional Comments: Vital signs were monitored before and after the procedure. Patient was prepped and draped in the usual sterile fashion. The correct patient, procedure, and site was verified.   Injection Procedure Details:  Procedure Site One Meds Administered:  Meds ordered this encounter  Medications  . methylPREDNISolone acetate (DEPO-MEDROL) injection 80 mg     Laterality: Left  Location/Site: C7-T1  Needle size: 20 G  Needle type: Touhy  Needle Placement: Paramedian epidural space  Findings:  -Comments: Excellent flow of contrast into the epidural space.  Procedure Details: Using a paramedian approach from the side mentioned above, the region overlying the inferior lamina was localized under fluoroscopic visualization and the soft tissues overlying this structure were infiltrated with 4 ml. of 1% Lidocaine without Epinephrine. A # 20 gauge, Tuohy needle was inserted into the epidural space using a paramedian approach.  The epidural space was localized using loss of resistance along with lateral and contralateral oblique bi-planar fluoroscopic views.  After negative aspirate for air, blood, and CSF, a 2 ml. volume of Isovue-250 was injected into the epidural space and the flow of contrast was observed. Radiographs were obtained for documentation purposes.   The injectate was administered into the level noted above.  Additional Comments:  The patient tolerated the procedure well Dressing: 2 x 2 sterile gauze and Band-Aid    Post-procedure details: Patient was observed during the procedure. Post-procedure instructions were reviewed.  Patient left  the clinic in stable condition.

## 2018-07-22 IMAGING — MR MR SHOULDER*R* W/O CM
4 of 5 series · 32 of 40 positions shown · non-contrast
Comparison: MRI right shoulder 04/18/2015.

CLINICAL DATA: Chronic right shoulder pain which is worst with
abduction. No known injury.

EXAM:
MRI OF THE RIGHT SHOULDER WITHOUT CONTRAST
TECHNIQUE: Multiplanar, multisequence MR imaging of the shoulder was performed.
No intravenous contrast was administered.

[Series 4: T2 fat-sat · oblique · 4.0mm · 0.59mm/px · 8 of 22 slices shown (1 of 3)]
[im 1/22]
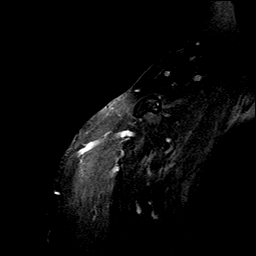
[im 4/22]
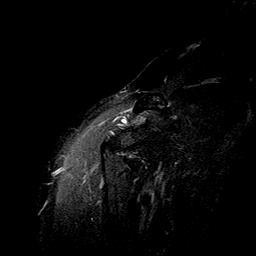
[im 7/22]
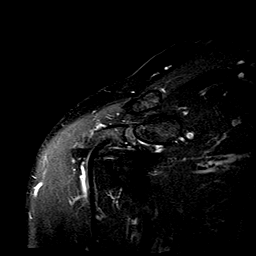
[im 10/22]
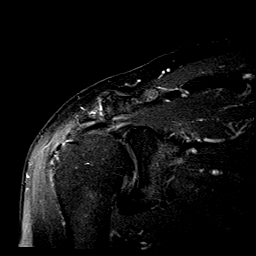
[im 13/22]
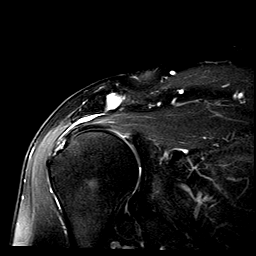
[im 16/22]
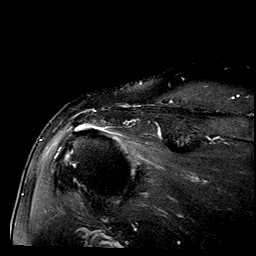
[im 19/22]
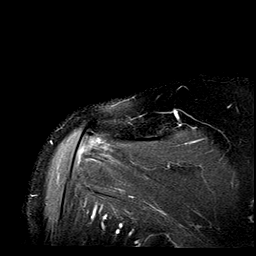
[im 22/22]
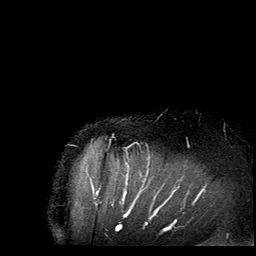

[Series 5: PD · oblique · 4.0mm · 0.23mm/px · 8 of 22 slices shown]
[im 1/22]
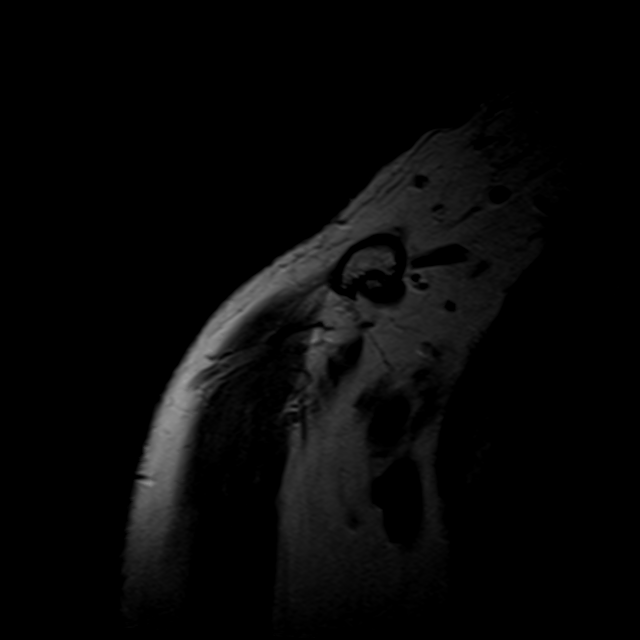
[im 4/22]
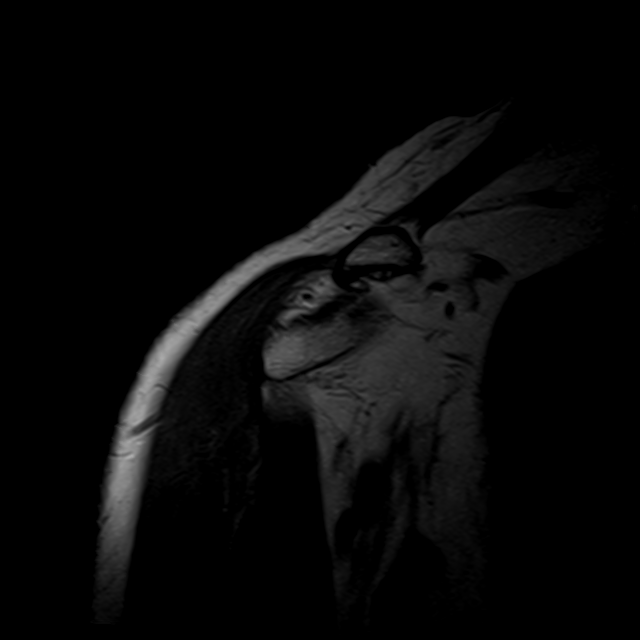
[im 7/22]
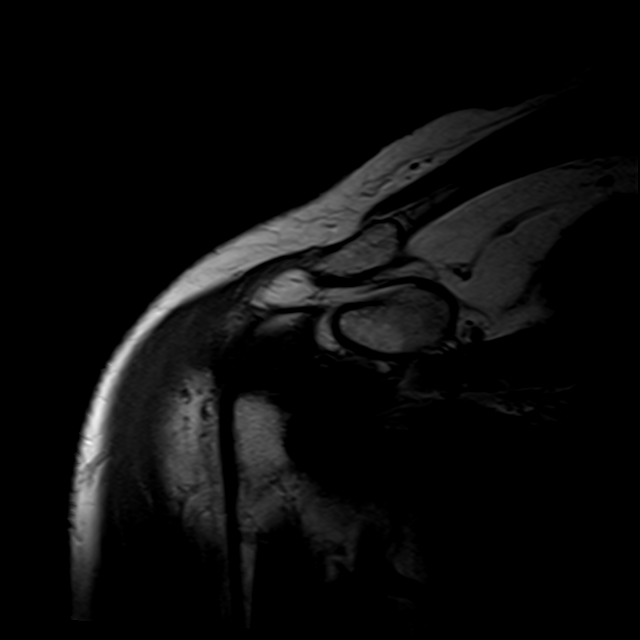
[im 10/22]
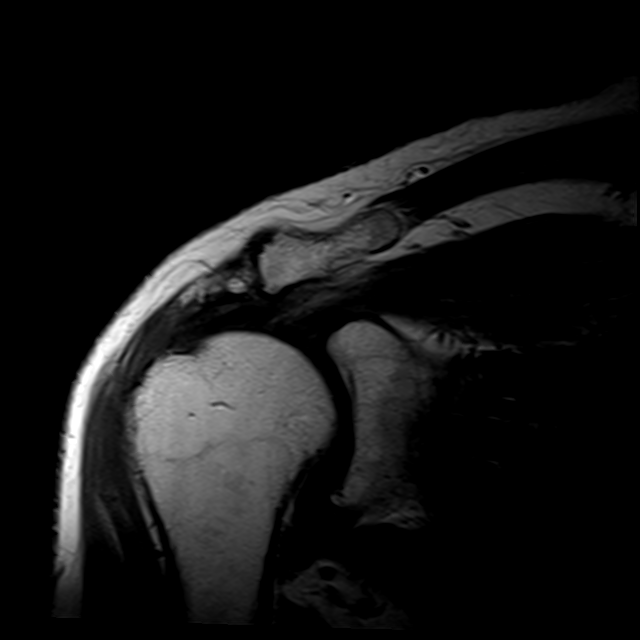
[im 13/22]
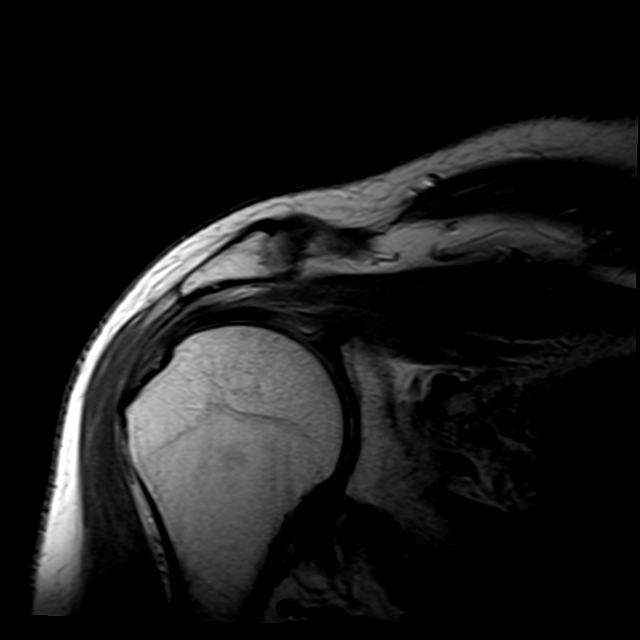
[im 16/22]
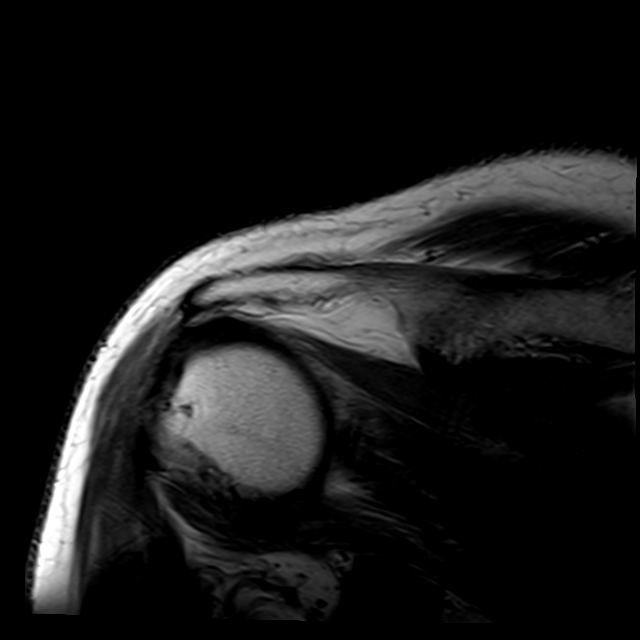
[im 19/22]
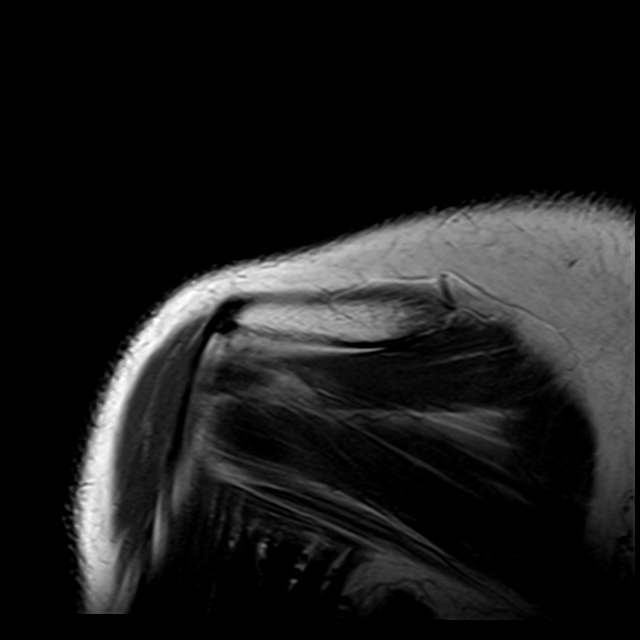
[im 22/22]
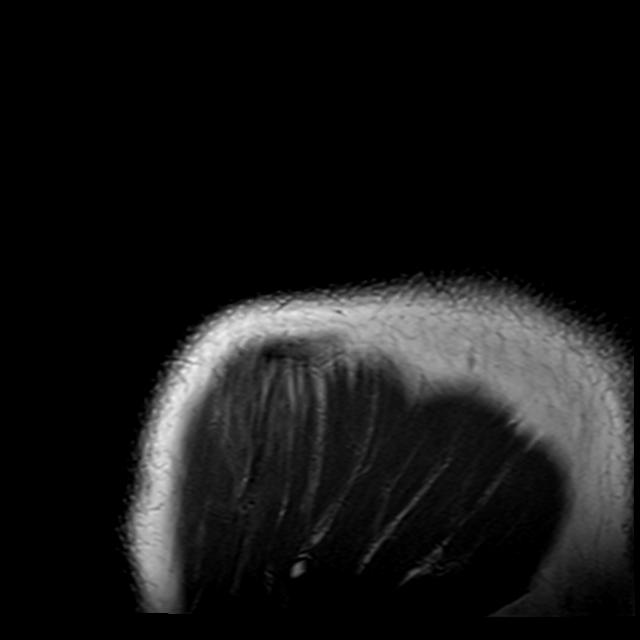

[Series 7: T2 fat-sat · oblique · 4.0mm · 0.59mm/px · 8 of 22 slices shown (2 of 3)]
[im 1/22]
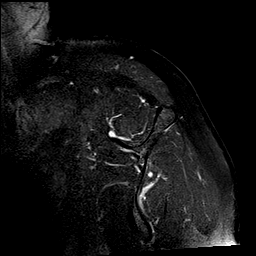
[im 4/22]
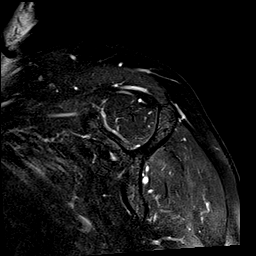
[im 7/22]
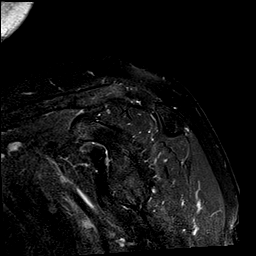
[im 10/22]
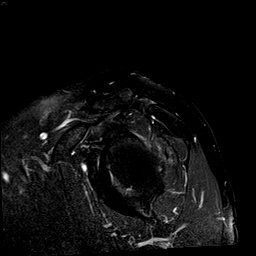
[im 13/22]
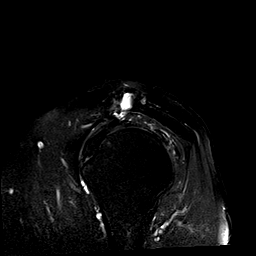
[im 16/22]
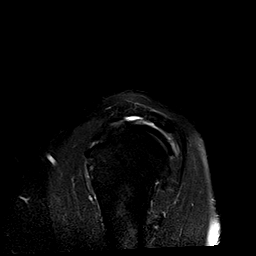
[im 19/22]
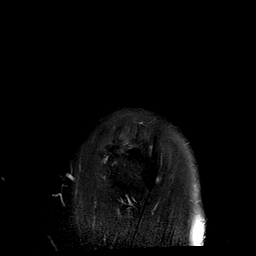
[im 22/22]
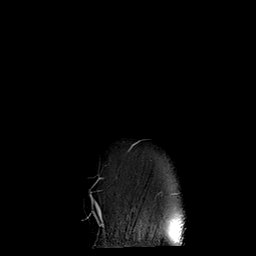

[Series 8: T2 fat-sat · axial · 4.0mm · 0.55mm/px · z∈[-8,+92]mm · 8 of 23 slices shown (3 of 3)]
[im 1/23]
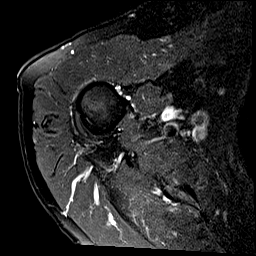
[im 4/23]
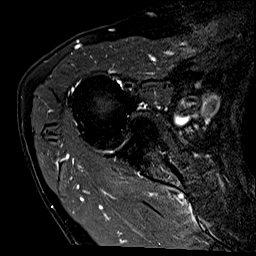
[im 7/23]
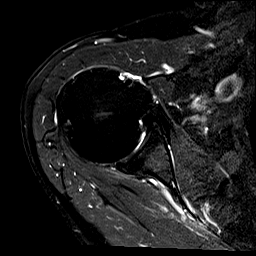
[im 10/23]
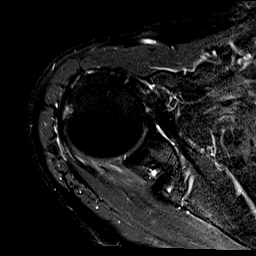
[im 13/23]
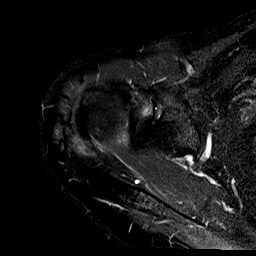
[im 16/23]
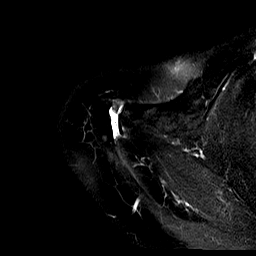
[im 19/23]
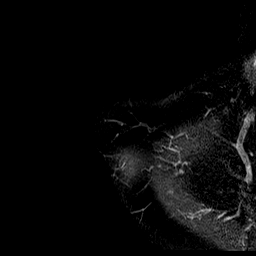
[im 23/23]
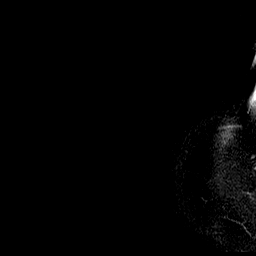

[32 of 40 positions shown; findings below may reference images not displayed]

FINDINGS: Rotator cuff: There is rotator cuff tendinopathy as seen on the
prior examination. Tendinopathy and fissuring in the infraspinatus
appear improved. The patient has an undersurface tear of the
anterior and far lateral supraspinatus measuring 0.7 cm from front
to back. No retraction.

Muscles:  No focal atrophy or lesion.

Biceps long head: Intact with tendinopathy of the intra-articular
segment noted.

Acromioclavicular Joint: Moderate degenerative disease is unchanged
in appearance. Type 2 acromion. Subacromial spur is noted. There is
some fluid in the subacromial/subdeltoid bursa.

Glenohumeral Joint: Unremarkable.

Labrum: No tear is seen. Degeneration of the posterior labrum is
unchanged.

Bones:  No fracture or worrisome lesion.

Other: None.
IMPRESSION: 0.7 cm from front to back undersurface tear of the anterior and far
lateral supraspinatus without retraction or atrophy is new since the
prior examination. Infraspinatus tendinopathy and fissuring seen on
prior exam appear improved.

Mild appearing tendinopathy of the intra-articular long head of
biceps without tear.

No change in moderate acromioclavicular osteoarthritis. Subacromial
spur is also unchanged.

Subacromial/subdeltoid fluid consistent with bursitis.

## 2018-07-26 ENCOUNTER — Ambulatory Visit (INDEPENDENT_AMBULATORY_CARE_PROVIDER_SITE_OTHER): Payer: Medicare Other | Admitting: Orthopaedic Surgery

## 2018-07-26 ENCOUNTER — Other Ambulatory Visit: Payer: Self-pay

## 2018-07-26 ENCOUNTER — Encounter: Payer: Self-pay | Admitting: Orthopaedic Surgery

## 2018-07-26 DIAGNOSIS — M25511 Pain in right shoulder: Secondary | ICD-10-CM

## 2018-07-26 DIAGNOSIS — M1711 Unilateral primary osteoarthritis, right knee: Secondary | ICD-10-CM

## 2018-07-26 DIAGNOSIS — G8929 Other chronic pain: Secondary | ICD-10-CM

## 2018-07-26 MED ORDER — METHYLPREDNISOLONE ACETATE 40 MG/ML IJ SUSP
40.0000 mg | INTRAMUSCULAR | Status: AC | PRN
  Administered 2018-07-26: 40 mg via INTRA_ARTICULAR

## 2018-07-26 MED ORDER — LIDOCAINE HCL 1 % IJ SOLN
2.0000 mL | INTRAMUSCULAR | Status: AC | PRN
  Administered 2018-07-26: 2 mL

## 2018-07-26 MED ORDER — BUPIVACAINE HCL 0.25 % IJ SOLN
2.0000 mL | INTRAMUSCULAR | Status: AC | PRN
  Administered 2018-07-26: 2 mL via INTRA_ARTICULAR

## 2018-07-26 NOTE — Progress Notes (Signed)
Office Visit Note   Patient: Douglas Lindsey           Date of Birth: 12-13-1945           MRN: [id number] Visit Date: 07/26/2018              Requested by: No referring provider defined for this encounter. PCP: System, Pcp Not In   Assessment & Plan: Visit Diagnoses:  1. Chronic right shoulder pain   2. Unilateral primary osteoarthritis, right knee     Plan: Impression is right shoulder AC arthropathy and rotator cuff tear, #2 right knee osteoarthritis flareup.  In regards to the right shoulder, the patient was referred to Dr. Junius Roads for an ultrasound-guided cortisone injection to the [facility] joint.  He will follow-up with [place] as needed.  We have discussed surgical intervention should it come down to this.  In regards to the right knee, I injected this with cortisone today.  He will take it easy over the next few days.  Follow-up as needed for his right knee.  Follow-Up Instructions: Return if symptoms worsen or fail to improve.   Orders:  Orders Placed This Encounter  Procedures  . Large Joint Inj: R knee   No orders of the defined types were placed in this encounter.     Procedures: Large Joint Inj: R knee on 07/26/2018 8:55 AM Indications: pain Details: 22 G needle, anterolateral approach Medications: 2 mL bupivacaine 0.25 %; 2 mL lidocaine 1 %; 40 mg methylPREDNISolone acetate 40 MG/ML      Clinical Data: No additional findings.   Subjective: Chief Complaint  Patient presents with  . Right Shoulder - Pain  . Right Knee - Pain    HPI patient is a pleasant 73 year old gentleman who presents our clinic today with right shoulder and right knee pain.  In regards to his right shoulder, this has been ongoing for over a year.  He has had subacromial cortisone injections as well as epidural steroid injections to his neck.  Both of been of moderate relief.  MRI from April 2019 of the right shoulder showed a small undersurface tear to the supraspinatus as well as moderate AC  degenerative changes.  Patient continues to have pain to the [facility] joint as well as the deltoid.  Worse with certain motions of the shoulder.  No numbness, tingling or burning.  No focal weakness.  In regards to the right knee, history of osteoarthritis.  This has been ongoing for a while.  Most of his pain is the medial compartment.  No mechanical symptoms.  He has tried over-the-counter medications without significant relief of symptoms.  Remote history of cortisone injection.  Review of Systems as detailed in HPI.  All others reviewed and are negative.   Objective: Vital Signs: There were no vitals taken for this visit.  Physical Exam well-developed and well-nourished gentleman in no acute distress.  Alert and oriented x3.  Ortho Exam examination of his right shoulder reveals full active range of motion all planes.  Minimally positive empty can and cross body adduction.  Marked tenderness over the [facility] joint.  No focal weakness.  He is neurovascularly intact distally.  Right knee exam shows no effusion.  Range of motion 0 to 115 degrees.  Medial joint line tenderness.  Ligaments are stable.  He is neurovascular intact distally.  Specialty Comments:  No specialty comments available.  Imaging: No new imaging   PMFS History: Patient Active Problem List   Diagnosis Date  Noted  . Pain in right hand 04/26/2018  . S/P carpal tunnel release 10/06/2017  . Chronic pain of both knees 08/31/2017  . Carpal tunnel syndrome on right 08/31/2017  . Degenerative tear of left medial meniscus 05/07/2017  . Derangement of anterior horn of lateral meniscus of left knee 05/07/2017  . Impingement syndrome of right shoulder 05/07/2017  . HYPERLIPIDEMIA 12/24/2009  . HYPERTENSION 12/24/2009  . PALPITATIONS 12/24/2009   Past Medical History:  Diagnosis Date  . Chronic lower back pain   . GERD (gastroesophageal reflux disease)   . Herpes simplex   . Hyperlipidemia   . Hypertension   . Sciatica     Family  History  Problem Relation Age of Onset  . Lung cancer Mother   . Stroke Father     Past Surgical History:  Procedure Laterality Date  . CARPAL TUNNEL RELEASE Right 09/22/2017   Procedure: RIGHT CARPAL TUNNEL RELEASE;  Surgeon: Leandrew Koyanagi, MD;  Location: [place];  Service: Orthopedics;  Laterality: Right;  . LAPAROSCOPIC CHOLECYSTECTOMY    . MASTECTOMY    . SHOULDER SURGERY Bilateral   . WRIST ARTHRODESIS Right    Social History   Occupational History  . Occupation: Full time - Administrator, sports: RETIRED  Tobacco Use  . Smoking status: Former Smoker    Packs/day: 1.00    Years: 10.00    Pack years: 10.00    Types: Cigarettes    Quit date: 04/05/1974    Years since quitting: 44.3  . Smokeless tobacco: Never Used  . Tobacco comment: quit 40 years ago  Substance and Sexual Activity  . Alcohol use: Yes    Alcohol/week: 0.0 standard drinks    Comment: Socially  . Drug use: Never  . Sexual activity: Not on file

## 2018-07-26 NOTE — Progress Notes (Signed)
Subjective: Patient is here for ultrasound-guided intra-articular right AC joint injection.  Objective:  Tender over right ACJ.  Procedure: Ultrasound-guided right ACJ injection: After sterile prep with Betadine, injected 3 cc 1% lidocaine without epinephrine and 40 mg methylprednisolone using a 25-gauge 1-1/2 inch needle, passing the needle through approach into the [facility] joint.  Good immediate relief.

## 2018-08-09 ENCOUNTER — Other Ambulatory Visit: Payer: Medicare Other

## 2018-08-09 ENCOUNTER — Other Ambulatory Visit: Payer: Self-pay

## 2018-08-09 DIAGNOSIS — Z20822 Contact with and (suspected) exposure to covid-19: Secondary | ICD-10-CM

## 2018-08-13 LAB — NOVEL CORONAVIRUS, NAA: SARS-CoV-2, NAA: NOT DETECTED

## 2018-09-27 ENCOUNTER — Telehealth: Payer: Self-pay

## 2018-09-27 ENCOUNTER — Ambulatory Visit (INDEPENDENT_AMBULATORY_CARE_PROVIDER_SITE_OTHER): Payer: Medicare Other | Admitting: Orthopaedic Surgery

## 2018-09-27 DIAGNOSIS — M25562 Pain in left knee: Secondary | ICD-10-CM | POA: Diagnosis not present

## 2018-09-27 DIAGNOSIS — M25561 Pain in right knee: Secondary | ICD-10-CM | POA: Diagnosis not present

## 2018-09-27 DIAGNOSIS — G8929 Other chronic pain: Secondary | ICD-10-CM

## 2018-09-27 NOTE — Telephone Encounter (Signed)
Noted  

## 2018-09-27 NOTE — Progress Notes (Signed)
Office Visit Note   Patient: Douglas Lindsey           Date of Birth: July 01, 1945           MRN: [id number] Visit Date: 09/27/2018              Requested by: No referring provider defined for this encounter. PCP: System, Pcp Not In   Assessment & Plan: Visit Diagnoses:  1. Chronic pain of both knees     Plan: Impression is chronic bilateral knee pain with mild OA.  Questionable fibromyalgia or chronic pain syndrome or neuropathy.  He does take gabapentin on a daily basis.  Overall his meniscus tear does not seem to be that symptomatic.  He would like to try [employer] supplementation injections for both knees.  We will obtain authorization at this point.  Follow-Up Instructions: Return if symptoms worsen or fail to improve.   Orders:  No orders of the defined types were placed in this encounter.  No orders of the defined types were placed in this encounter.     Procedures: No procedures performed   Clinical Data: No additional findings.   Subjective: Chief Complaint  Patient presents with  . Right Knee - Pain  . Left Knee - Pain    Douglas Lindsey returns today for bilateral knee pain.  He has had some temporary relief from cortisone injections.  Denies any mechanical symptoms.  He also is complaining of bilateral foot pain with walking and bilateral shin pain last week.  Denies any numbness and tingling   Review of Systems   Objective: Vital Signs: There were no vitals taken for this visit.  Physical Exam  Ortho Exam Bilateral knee exams are unchanged. Specialty Comments:  No specialty comments available.  Imaging: No results found.   PMFS History: Patient Active Problem List   Diagnosis Date Noted  . Pain in right hand 04/26/2018  . S/P carpal tunnel release 10/06/2017  . Chronic pain of both knees 08/31/2017  . Carpal tunnel syndrome on right 08/31/2017  . Degenerative tear of left medial meniscus 05/07/2017  . Derangement of anterior horn of lateral  meniscus of left knee 05/07/2017  . Impingement syndrome of right shoulder 05/07/2017  . HYPERLIPIDEMIA 12/24/2009  . HYPERTENSION 12/24/2009  . PALPITATIONS 12/24/2009   Past Medical History:  Diagnosis Date  . Chronic lower back pain   . GERD (gastroesophageal reflux disease)   . Herpes simplex   . Hyperlipidemia   . Hypertension   . Sciatica     Family History  Problem Relation Age of Onset  . Lung cancer Mother   . Stroke Father     Past Surgical History:  Procedure Laterality Date  . CARPAL TUNNEL RELEASE Right 09/22/2017   Procedure: RIGHT CARPAL TUNNEL RELEASE;  Surgeon: Leandrew Koyanagi, MD;  Location: [place];  Service: Orthopedics;  Laterality: Right;  . LAPAROSCOPIC CHOLECYSTECTOMY    . MASTECTOMY    . SHOULDER SURGERY Bilateral   . WRIST ARTHRODESIS Right    Social History   Occupational History  . Occupation: Full time - Administrator, sports: RETIRED  Tobacco Use  . Smoking status: Former Smoker    Packs/day: 1.00    Years: 10.00    Pack years: 10.00    Types: Cigarettes    Quit date: 04/05/1974    Years since quitting: 44.5  . Smokeless tobacco: Never Used  . Tobacco comment: quit 40 years ago  Substance and  Sexual Activity  . Alcohol use: Yes    Alcohol/week: 0.0 standard drinks    Comment: Socially  . Drug use: Never  . Sexual activity: Not on file

## 2018-09-27 NOTE — Telephone Encounter (Signed)
Can you please get him approved for bilateral gel injections. Thanks.

## 2018-10-07 ENCOUNTER — Telehealth: Payer: Self-pay

## 2018-10-07 NOTE — Telephone Encounter (Signed)
Submitted VOB for SynviscOne, bilateral knee. 

## 2018-10-10 ENCOUNTER — Telehealth: Payer: Self-pay

## 2018-10-10 NOTE — Telephone Encounter (Signed)
Patient aware that he is approved for gel injection.  Patient stated that he will call the office back to schedule appointment with Dr. Erlinda Hong.  Approved for [facility], bilateral knee. [place] Patient will be responsible for 20% OOP. Co-pay of $35.00 required No PA required

## 2018-10-18 ENCOUNTER — Encounter: Payer: Self-pay | Admitting: Orthopaedic Surgery

## 2018-10-18 ENCOUNTER — Ambulatory Visit (INDEPENDENT_AMBULATORY_CARE_PROVIDER_SITE_OTHER): Payer: Medicare Other | Admitting: Orthopaedic Surgery

## 2018-10-18 ENCOUNTER — Other Ambulatory Visit: Payer: Self-pay

## 2018-10-18 DIAGNOSIS — M1712 Unilateral primary osteoarthritis, left knee: Secondary | ICD-10-CM

## 2018-10-18 DIAGNOSIS — M1711 Unilateral primary osteoarthritis, right knee: Secondary | ICD-10-CM | POA: Diagnosis not present

## 2018-10-18 MED ORDER — BUPIVACAINE HCL 0.5 % IJ SOLN
2.0000 mL | INTRAMUSCULAR | Status: AC | PRN
  Administered 2018-10-18: 2 mL via INTRA_ARTICULAR

## 2018-10-18 MED ORDER — HYLAN G-F 20 48 MG/6ML IX SOSY
48.0000 mg | PREFILLED_SYRINGE | INTRA_ARTICULAR | Status: AC | PRN
  Administered 2018-10-18: 48 mg via INTRA_ARTICULAR

## 2018-10-18 MED ORDER — LIDOCAINE HCL 1 % IJ SOLN
2.0000 mL | INTRAMUSCULAR | Status: AC | PRN
  Administered 2018-10-18: 2 mL

## 2018-10-18 MED ORDER — LIDOCAINE HCL 1 % IJ SOLN
2.0000 mL | INTRAMUSCULAR | Status: AC | PRN
  Administered 2018-10-18: 11:00:00 2 mL

## 2018-10-18 NOTE — Progress Notes (Signed)
   Procedure Note  Patient: Douglas Lindsey             Date of Birth: [birth year]           MRN: [id number]             Visit Date: 10/18/2018  Procedures: Visit Diagnoses:  1. Primary osteoarthritis of left knee   2. Primary osteoarthritis of right knee     Large Joint Inj: bilateral knee on 10/18/2018 10:38 AM Indications: pain Details: 22 G needle  Arthrogram: No  Medications (Right): 2 mL lidocaine 1 %; 2 mL bupivacaine 0.5 %; 48 mg Hylan 48 MG/6ML Medications (Left): 2 mL lidocaine 1 %; 2 mL bupivacaine 0.5 %; 48 mg Hylan 48 MG/6ML Outcome: tolerated well, no immediate complications Patient was prepped and draped in the usual sterile fashion.

## 2019-01-11 ENCOUNTER — Ambulatory Visit: Payer: Medicare Other | Admitting: Orthopaedic Surgery
